# Patient Record
Sex: Female | Born: 1950 | Hispanic: No | Marital: Married
Health system: Southern US, Community
[De-identification: ages and names within clinical notes are randomized; demographics above are authoritative.]

## PROBLEM LIST (undated history)

## (undated) DIAGNOSIS — U071 COVID-19: Secondary | ICD-10-CM

## (undated) DIAGNOSIS — K922 Gastrointestinal hemorrhage, unspecified: Secondary | ICD-10-CM

## (undated) DIAGNOSIS — E119 Type 2 diabetes mellitus without complications: Secondary | ICD-10-CM

## (undated) DIAGNOSIS — I2699 Other pulmonary embolism without acute cor pulmonale: Secondary | ICD-10-CM

## (undated) DIAGNOSIS — I63511 Cerebral infarction due to unspecified occlusion or stenosis of right middle cerebral artery: Secondary | ICD-10-CM

## (undated) DIAGNOSIS — J9621 Acute and chronic respiratory failure with hypoxia: Secondary | ICD-10-CM

## (undated) DIAGNOSIS — I639 Cerebral infarction, unspecified: Secondary | ICD-10-CM

---

## 2019-12-12 ENCOUNTER — Other Ambulatory Visit (HOSPITAL_COMMUNITY): Payer: Medicare HMO

## 2019-12-12 ENCOUNTER — Inpatient Hospital Stay
Admission: RE | Admit: 2019-12-12 | Discharge: 2019-12-16 | Disposition: A | Discharge status: Still In Hospital | Payer: Medicare HMO | Source: Other Acute Inpatient Hospital | Attending: Internal Medicine | Admitting: Internal Medicine

## 2019-12-12 DIAGNOSIS — Z931 Gastrostomy status: Secondary | ICD-10-CM

## 2019-12-12 DIAGNOSIS — R58 Hemorrhage, not elsewhere classified: Secondary | ICD-10-CM

## 2019-12-12 DIAGNOSIS — Z930 Tracheostomy status: Secondary | ICD-10-CM

## 2019-12-12 DIAGNOSIS — Z452 Encounter for adjustment and management of vascular access device: Secondary | ICD-10-CM

## 2019-12-13 LAB — COMPREHENSIVE METABOLIC PANEL
ALT: 170 U/L — ABNORMAL HIGH (ref 0–44)
AST: 59 U/L — ABNORMAL HIGH (ref 15–41)
Albumin: 1.7 g/dL — ABNORMAL LOW (ref 3.5–5.0)
Alkaline Phosphatase: 365 U/L — ABNORMAL HIGH (ref 38–126)
Anion gap: 9 (ref 5–15)
BUN: 36 mg/dL — ABNORMAL HIGH (ref 8–23)
CO2: 24 mmol/L (ref 22–32)
Calcium: 8.2 mg/dL — ABNORMAL LOW (ref 8.9–10.3)
Chloride: 116 mmol/L — ABNORMAL HIGH (ref 98–111)
Creatinine, Ser: 1.1 mg/dL — ABNORMAL HIGH (ref 0.44–1.00)
GFR calc Af Amer: >60 mL/min (ref >60)
GFR calc non Af Amer: 52 mL/min — ABNORMAL LOW (ref >60)
Glucose, Bld: 117 mg/dL — ABNORMAL HIGH (ref 70–99)
Potassium: 3.8 mmol/L (ref 3.5–5.1)
Sodium: 149 mmol/L — ABNORMAL HIGH (ref 135–145)
Total Bilirubin: 0.4 mg/dL (ref 0.3–1.2)
Total Protein: 5.6 g/dL — ABNORMAL LOW (ref 6.5–8.1)

## 2019-12-13 LAB — BLOOD GAS, ARTERIAL
Acid-Base Excess: 0.5 mmol/L (ref 0.0–2.0)
Bicarbonate: 24.6 mmol/L (ref 20.0–28.0)
FIO2: 28
O2 Saturation: 95.1 %
Patient temperature: 37
pCO2 arterial: 39.2 mmHg (ref 32.0–48.0)
pH, Arterial: 7.414 (ref 7.350–7.450)
pO2, Arterial: 70.7 mmHg — ABNORMAL LOW (ref 83.0–108.0)

## 2019-12-13 LAB — PROTIME-INR
INR: 1 (ref 0.8–1.2)
Prothrombin Time: 13.5 seconds (ref 11.4–15.2)

## 2019-12-13 LAB — TSH: TSH: 1.725 u[IU]/mL (ref 0.350–4.500)

## 2019-12-13 LAB — CBC
HCT: 28.3 % — ABNORMAL LOW (ref 36.0–46.0)
Hemoglobin: 8.6 g/dL — ABNORMAL LOW (ref 12.0–15.0)
MCH: 30 pg (ref 26.0–34.0)
MCHC: 30.4 g/dL (ref 30.0–36.0)
MCV: 98.6 fL (ref 80.0–100.0)
Platelets: 144 10*3/uL — ABNORMAL LOW (ref 150–400)
RBC: 2.87 MIL/uL — ABNORMAL LOW (ref 3.87–5.11)
RDW: 15.3 % (ref 11.5–15.5)
WBC: 11.7 10*3/uL — ABNORMAL HIGH (ref 4.0–10.5)
nRBC: 0 % (ref 0.0–0.2)

## 2019-12-13 LAB — APTT: aPTT: 27 seconds (ref 24–36)

## 2019-12-13 MED ORDER — AMLODIPINE BESYLATE 10 MG PO TABS
10.00 | ORAL_TABLET | ORAL | Status: DC
Start: 2019-12-13 — End: 2019-12-13

## 2019-12-13 MED ORDER — GENERIC EXTERNAL MEDICATION
Status: DC
Start: ? — End: 2019-12-13

## 2019-12-13 MED ORDER — AMANTADINE HCL 100 MG PO CAPS
100.00 | ORAL_CAPSULE | ORAL | Status: DC
Start: 2019-12-12 — End: 2019-12-13

## 2019-12-13 MED ORDER — SODIUM CHLORIDE 0.9 % IV SOLN
10.00 | INTRAVENOUS | Status: DC
Start: ? — End: 2019-12-13

## 2019-12-13 MED ORDER — DEXTROSE-SODIUM CHLORIDE 5-0.9 % IV SOLN
50.00 | INTRAVENOUS | Status: DC
Start: ? — End: 2019-12-13

## 2019-12-13 MED ORDER — LABETALOL HCL 5 MG/ML IV SOLN
10.00 | INTRAVENOUS | Status: DC
Start: ? — End: 2019-12-13

## 2019-12-13 MED ORDER — FENTANYL CITRATE (PF) 2500 MCG/50ML IJ SOLN
25.00 | INTRAMUSCULAR | Status: DC
Start: ? — End: 2019-12-13

## 2019-12-13 MED ORDER — INSULIN GLARGINE 100 UNIT/ML ~~LOC~~ SOLN
1.00 | SUBCUTANEOUS | Status: DC
Start: 2019-12-13 — End: 2019-12-13

## 2019-12-13 MED ORDER — CHOLECALCIFEROL 25 MCG (1000 UT) PO TABS
4000.00 | ORAL_TABLET | ORAL | Status: DC
Start: 2019-12-13 — End: 2019-12-13

## 2019-12-13 MED ORDER — LEVOTHYROXINE SODIUM 75 MCG PO TABS
75.00 | ORAL_TABLET | ORAL | Status: DC
Start: 2019-12-12 — End: 2019-12-13

## 2019-12-13 MED ORDER — CLOTRIMAZOLE-BETAMETHASONE 1-0.05 % EX CREA
TOPICAL_CREAM | CUTANEOUS | Status: DC
Start: 2019-12-12 — End: 2019-12-13

## 2019-12-13 MED ORDER — INSULIN LISPRO 100 UNIT/ML ~~LOC~~ SOLN
1.00 | SUBCUTANEOUS | Status: DC
Start: 2019-12-12 — End: 2019-12-13

## 2019-12-13 MED ORDER — MAGNESIUM OXIDE 400 MG PO TABS
800.00 | ORAL_TABLET | ORAL | Status: DC
Start: 2019-12-12 — End: 2019-12-13

## 2019-12-13 MED ORDER — INSULIN LISPRO 100 UNIT/ML ~~LOC~~ SOLN
1.00 | SUBCUTANEOUS | Status: DC
Start: ? — End: 2019-12-13

## 2019-12-13 MED ORDER — METOPROLOL TARTRATE 5 MG/5ML IV SOLN
5.00 | INTRAVENOUS | Status: DC
Start: ? — End: 2019-12-13

## 2019-12-13 MED ORDER — LABETALOL HCL 200 MG PO TABS
200.00 | ORAL_TABLET | ORAL | Status: DC
Start: 2019-12-12 — End: 2019-12-13

## 2019-12-13 MED ORDER — PANTOPRAZOLE SODIUM 40 MG IV SOLR
40.00 | INTRAVENOUS | Status: DC
Start: 2019-12-12 — End: 2019-12-13

## 2019-12-13 MED ORDER — ALUM HYDROXIDE-MAG TRISILICATE 80-20 MG PO CHEW
1000.00 | CHEWABLE_TABLET | ORAL | Status: DC
Start: ? — End: 2019-12-13

## 2019-12-13 MED ORDER — DEXMEDETOMIDINE HCL IN NACL 400 MCG/100ML IV SOLN
0.10 | INTRAVENOUS | Status: DC
Start: ? — End: 2019-12-13

## 2019-12-14 ENCOUNTER — Other Ambulatory Visit (HOSPITAL_COMMUNITY): Payer: Medicare HMO

## 2019-12-14 LAB — CBC
HCT: 26.9 % — ABNORMAL LOW (ref 36.0–46.0)
HCT: 23.4 % — ABNORMAL LOW (ref 36.0–46.0)
HCT: 22.1 % — ABNORMAL LOW (ref 36.0–46.0)
HCT: 22.9 % — ABNORMAL LOW (ref 36.0–46.0)
Hemoglobin: 8.5 g/dL — ABNORMAL LOW (ref 12.0–15.0)
Hemoglobin: 7.3 g/dL — ABNORMAL LOW (ref 12.0–15.0)
Hemoglobin: 6.9 g/dL — CL (ref 12.0–15.0)
Hemoglobin: 7.4 g/dL — ABNORMAL LOW (ref 12.0–15.0)
MCH: 30.5 pg (ref 26.0–34.0)
MCH: 30.3 pg (ref 26.0–34.0)
MCH: 30.5 pg (ref 26.0–34.0)
MCH: 30.3 pg (ref 26.0–34.0)
MCHC: 31.6 g/dL (ref 30.0–36.0)
MCHC: 31.2 g/dL (ref 30.0–36.0)
MCHC: 31.2 g/dL (ref 30.0–36.0)
MCHC: 32.3 g/dL (ref 30.0–36.0)
MCV: 96.4 fL (ref 80.0–100.0)
MCV: 97.1 fL (ref 80.0–100.0)
MCV: 97.8 fL (ref 80.0–100.0)
MCV: 93.9 fL (ref 80.0–100.0)
Platelets: 159 10*3/uL (ref 150–400)
Platelets: 150 10*3/uL (ref 150–400)
Platelets: 145 10*3/uL — ABNORMAL LOW (ref 150–400)
Platelets: 145 10*3/uL — ABNORMAL LOW (ref 150–400)
RBC: 2.79 MIL/uL — ABNORMAL LOW (ref 3.87–5.11)
RBC: 2.41 MIL/uL — ABNORMAL LOW (ref 3.87–5.11)
RBC: 2.26 MIL/uL — ABNORMAL LOW (ref 3.87–5.11)
RBC: 2.44 MIL/uL — ABNORMAL LOW (ref 3.87–5.11)
RDW: 15.7 % — ABNORMAL HIGH (ref 11.5–15.5)
RDW: 15.6 % — ABNORMAL HIGH (ref 11.5–15.5)
RDW: 15.8 % — ABNORMAL HIGH (ref 11.5–15.5)
RDW: 16.7 % — ABNORMAL HIGH (ref 11.5–15.5)
WBC: 11.9 10*3/uL — ABNORMAL HIGH (ref 4.0–10.5)
WBC: 9.7 10*3/uL (ref 4.0–10.5)
WBC: 11.1 10*3/uL — ABNORMAL HIGH (ref 4.0–10.5)
WBC: 11.9 10*3/uL — ABNORMAL HIGH (ref 4.0–10.5)
nRBC: 0 % (ref 0.0–0.2)
nRBC: 0 % (ref 0.0–0.2)
nRBC: 0 % (ref 0.0–0.2)
nRBC: 0.2 % (ref 0.0–0.2)

## 2019-12-14 LAB — DIC (DISSEMINATED INTRAVASCULAR COAGULATION)PANEL
D-Dimer, Quant: 3.3 ug/mL-FEU — ABNORMAL HIGH (ref 0.00–0.50)
Fibrinogen: 612 mg/dL — ABNORMAL HIGH (ref 210–475)
INR: 1.1 (ref 0.8–1.2)
Platelets: 138 10*3/uL — ABNORMAL LOW (ref 150–400)
Prothrombin Time: 14.1 seconds (ref 11.4–15.2)
Smear Review: NONE SEEN
aPTT: 29 seconds (ref 24–36)

## 2019-12-14 LAB — PREPARE RBC (CROSSMATCH)

## 2019-12-14 LAB — ABO/RH: ABO/RH(D): A POS

## 2019-12-14 MED ORDER — GENERIC EXTERNAL MEDICATION
Status: DC
Start: ? — End: 2019-12-14

## 2019-12-14 MED ORDER — IOHEXOL 300 MG/ML  SOLN
100.0000 mL | Freq: Once | INTRAMUSCULAR | Status: AC | PRN
  Administered 2019-12-14: 100 mL via INTRAVENOUS

## 2019-12-15 ENCOUNTER — Other Ambulatory Visit (HOSPITAL_COMMUNITY): Payer: Medicare HMO

## 2019-12-15 LAB — CBC
HCT: 21.2 % — ABNORMAL LOW (ref 36.0–46.0)
HCT: 22.6 % — ABNORMAL LOW (ref 36.0–46.0)
HCT: 24.3 % — ABNORMAL LOW (ref 36.0–46.0)
HCT: 20.4 % — ABNORMAL LOW (ref 36.0–46.0)
Hemoglobin: 6.8 g/dL — CL (ref 12.0–15.0)
Hemoglobin: 7.4 g/dL — ABNORMAL LOW (ref 12.0–15.0)
Hemoglobin: 8.1 g/dL — ABNORMAL LOW (ref 12.0–15.0)
Hemoglobin: 6.6 g/dL — CL (ref 12.0–15.0)
MCH: 30.4 pg (ref 26.0–34.0)
MCH: 30.5 pg (ref 26.0–34.0)
MCH: 30.6 pg (ref 26.0–34.0)
MCH: 30.3 pg (ref 26.0–34.0)
MCHC: 32.1 g/dL (ref 30.0–36.0)
MCHC: 32.7 g/dL (ref 30.0–36.0)
MCHC: 33.3 g/dL (ref 30.0–36.0)
MCHC: 32.4 g/dL (ref 30.0–36.0)
MCV: 94.6 fL (ref 80.0–100.0)
MCV: 93 fL (ref 80.0–100.0)
MCV: 91.7 fL (ref 80.0–100.0)
MCV: 93.6 fL (ref 80.0–100.0)
Platelets: 113 10*3/uL — ABNORMAL LOW (ref 150–400)
Platelets: 114 10*3/uL — ABNORMAL LOW (ref 150–400)
Platelets: 104 10*3/uL — ABNORMAL LOW (ref 150–400)
Platelets: 105 10*3/uL — ABNORMAL LOW (ref 150–400)
RBC: 2.24 MIL/uL — ABNORMAL LOW (ref 3.87–5.11)
RBC: 2.43 MIL/uL — ABNORMAL LOW (ref 3.87–5.11)
RBC: 2.65 MIL/uL — ABNORMAL LOW (ref 3.87–5.11)
RBC: 2.18 MIL/uL — ABNORMAL LOW (ref 3.87–5.11)
RDW: 16.8 % — ABNORMAL HIGH (ref 11.5–15.5)
RDW: 16.4 % — ABNORMAL HIGH (ref 11.5–15.5)
RDW: 16.5 % — ABNORMAL HIGH (ref 11.5–15.5)
RDW: 16.6 % — ABNORMAL HIGH (ref 11.5–15.5)
WBC: 12.4 10*3/uL — ABNORMAL HIGH (ref 4.0–10.5)
WBC: 12 10*3/uL — ABNORMAL HIGH (ref 4.0–10.5)
WBC: 12.6 10*3/uL — ABNORMAL HIGH (ref 4.0–10.5)
WBC: 10.1 10*3/uL (ref 4.0–10.5)
nRBC: 0.2 % (ref 0.0–0.2)
nRBC: 0 % (ref 0.0–0.2)
nRBC: 0.2 % (ref 0.0–0.2)
nRBC: 0.2 % (ref 0.0–0.2)

## 2019-12-15 LAB — PREPARE RBC (CROSSMATCH)

## 2019-12-15 MED ORDER — GENERIC EXTERNAL MEDICATION
Status: DC
Start: ? — End: 2019-12-15

## 2019-12-16 ENCOUNTER — Other Ambulatory Visit: Payer: Self-pay

## 2019-12-16 ENCOUNTER — Encounter (HOSPITAL_COMMUNITY): Payer: Self-pay | Admitting: Emergency Medicine

## 2019-12-16 ENCOUNTER — Inpatient Hospital Stay (HOSPITAL_COMMUNITY)
Admission: EM | Admit: 2019-12-16 | Discharge: 2019-12-19 | DRG: 377 | Disposition: A | Discharge status: Other Acute Inpatient Hospital | Payer: Medicare HMO | Source: Other Acute Inpatient Hospital | Attending: Internal Medicine | Admitting: Internal Medicine

## 2019-12-16 ENCOUNTER — Emergency Department (HOSPITAL_COMMUNITY): Payer: Medicare HMO

## 2019-12-16 DIAGNOSIS — Z794 Long term (current) use of insulin: Secondary | ICD-10-CM

## 2019-12-16 DIAGNOSIS — Z9981 Dependence on supplemental oxygen: Secondary | ICD-10-CM

## 2019-12-16 DIAGNOSIS — Z79890 Hormone replacement therapy (postmenopausal): Secondary | ICD-10-CM

## 2019-12-16 DIAGNOSIS — Z8709 Personal history of other diseases of the respiratory system: Secondary | ICD-10-CM | POA: Diagnosis not present

## 2019-12-16 DIAGNOSIS — I2699 Other pulmonary embolism without acute cor pulmonale: Secondary | ICD-10-CM | POA: Diagnosis not present

## 2019-12-16 DIAGNOSIS — Z930 Tracheostomy status: Secondary | ICD-10-CM

## 2019-12-16 DIAGNOSIS — I69398 Other sequelae of cerebral infarction: Secondary | ICD-10-CM | POA: Diagnosis not present

## 2019-12-16 DIAGNOSIS — Z7189 Other specified counseling: Secondary | ICD-10-CM | POA: Diagnosis not present

## 2019-12-16 DIAGNOSIS — K921 Melena: Principal | ICD-10-CM | POA: Diagnosis present

## 2019-12-16 DIAGNOSIS — J189 Pneumonia, unspecified organism: Secondary | ICD-10-CM

## 2019-12-16 DIAGNOSIS — Z931 Gastrostomy status: Secondary | ICD-10-CM | POA: Diagnosis not present

## 2019-12-16 DIAGNOSIS — U071 COVID-19: Secondary | ICD-10-CM | POA: Diagnosis not present

## 2019-12-16 DIAGNOSIS — N939 Abnormal uterine and vaginal bleeding, unspecified: Secondary | ICD-10-CM | POA: Diagnosis present

## 2019-12-16 DIAGNOSIS — R69 Illness, unspecified: Secondary | ICD-10-CM | POA: Diagnosis not present

## 2019-12-16 DIAGNOSIS — E11649 Type 2 diabetes mellitus with hypoglycemia without coma: Secondary | ICD-10-CM | POA: Diagnosis not present

## 2019-12-16 DIAGNOSIS — Z8616 Personal history of COVID-19: Secondary | ICD-10-CM | POA: Diagnosis not present

## 2019-12-16 DIAGNOSIS — E8809 Other disorders of plasma-protein metabolism, not elsewhere classified: Secondary | ICD-10-CM | POA: Diagnosis present

## 2019-12-16 DIAGNOSIS — E119 Type 2 diabetes mellitus without complications: Secondary | ICD-10-CM | POA: Diagnosis not present

## 2019-12-16 DIAGNOSIS — Z79899 Other long term (current) drug therapy: Secondary | ICD-10-CM | POA: Diagnosis not present

## 2019-12-16 DIAGNOSIS — J9620 Acute and chronic respiratory failure, unspecified whether with hypoxia or hypercapnia: Secondary | ICD-10-CM | POA: Diagnosis present

## 2019-12-16 DIAGNOSIS — R601 Generalized edema: Secondary | ICD-10-CM | POA: Diagnosis present

## 2019-12-16 DIAGNOSIS — L89150 Pressure ulcer of sacral region, unstageable: Secondary | ICD-10-CM | POA: Diagnosis present

## 2019-12-16 DIAGNOSIS — Z7982 Long term (current) use of aspirin: Secondary | ICD-10-CM | POA: Diagnosis not present

## 2019-12-16 DIAGNOSIS — I10 Essential (primary) hypertension: Secondary | ICD-10-CM | POA: Diagnosis present

## 2019-12-16 DIAGNOSIS — Z7401 Bed confinement status: Secondary | ICD-10-CM | POA: Diagnosis not present

## 2019-12-16 DIAGNOSIS — I4891 Unspecified atrial fibrillation: Secondary | ICD-10-CM | POA: Diagnosis present

## 2019-12-16 DIAGNOSIS — K922 Gastrointestinal hemorrhage, unspecified: Secondary | ICD-10-CM | POA: Diagnosis present

## 2019-12-16 DIAGNOSIS — Z8673 Personal history of transient ischemic attack (TIA), and cerebral infarction without residual deficits: Secondary | ICD-10-CM | POA: Diagnosis not present

## 2019-12-16 DIAGNOSIS — L89159 Pressure ulcer of sacral region, unspecified stage: Secondary | ICD-10-CM | POA: Diagnosis not present

## 2019-12-16 DIAGNOSIS — E039 Hypothyroidism, unspecified: Secondary | ICD-10-CM | POA: Diagnosis present

## 2019-12-16 DIAGNOSIS — G9349 Other encephalopathy: Secondary | ICD-10-CM | POA: Diagnosis present

## 2019-12-16 DIAGNOSIS — D62 Acute posthemorrhagic anemia: Secondary | ICD-10-CM | POA: Diagnosis present

## 2019-12-16 DIAGNOSIS — Z885 Allergy status to narcotic agent status: Secondary | ICD-10-CM

## 2019-12-16 DIAGNOSIS — R403 Persistent vegetative state: Secondary | ICD-10-CM | POA: Diagnosis present

## 2019-12-16 DIAGNOSIS — J9621 Acute and chronic respiratory failure with hypoxia: Secondary | ICD-10-CM | POA: Diagnosis not present

## 2019-12-16 DIAGNOSIS — E669 Obesity, unspecified: Secondary | ICD-10-CM | POA: Diagnosis present

## 2019-12-16 DIAGNOSIS — Z6835 Body mass index (BMI) 35.0-35.9, adult: Secondary | ICD-10-CM

## 2019-12-16 DIAGNOSIS — Z86711 Personal history of pulmonary embolism: Secondary | ICD-10-CM

## 2019-12-16 DIAGNOSIS — J690 Pneumonitis due to inhalation of food and vomit: Secondary | ICD-10-CM | POA: Diagnosis present

## 2019-12-16 DIAGNOSIS — I69318 Other symptoms and signs involving cognitive functions following cerebral infarction: Secondary | ICD-10-CM | POA: Diagnosis not present

## 2019-12-16 DIAGNOSIS — J9600 Acute respiratory failure, unspecified whether with hypoxia or hypercapnia: Secondary | ICD-10-CM

## 2019-12-16 DIAGNOSIS — J90 Pleural effusion, not elsewhere classified: Secondary | ICD-10-CM | POA: Diagnosis not present

## 2019-12-16 DIAGNOSIS — Z9889 Other specified postprocedural states: Secondary | ICD-10-CM | POA: Diagnosis not present

## 2019-12-16 DIAGNOSIS — R918 Other nonspecific abnormal finding of lung field: Secondary | ICD-10-CM | POA: Diagnosis not present

## 2019-12-16 DIAGNOSIS — L8990 Pressure ulcer of unspecified site, unspecified stage: Secondary | ICD-10-CM | POA: Insufficient documentation

## 2019-12-16 DIAGNOSIS — Z515 Encounter for palliative care: Secondary | ICD-10-CM

## 2019-12-16 DIAGNOSIS — I63511 Cerebral infarction due to unspecified occlusion or stenosis of right middle cerebral artery: Secondary | ICD-10-CM | POA: Diagnosis not present

## 2019-12-16 HISTORY — DX: Cerebral infarction, unspecified: I63.9

## 2019-12-16 HISTORY — DX: Type 2 diabetes mellitus without complications: E11.9

## 2019-12-16 HISTORY — DX: COVID-19: U07.1

## 2019-12-16 HISTORY — DX: Other pulmonary embolism without acute cor pulmonale: I26.99

## 2019-12-16 LAB — BPAM RBC
Blood Product Expiration Date: 202103182359
Blood Product Expiration Date: 202103212359
Blood Product Expiration Date: 202103222359
ISSUE DATE / TIME: 202102251555
ISSUE DATE / TIME: 202102260632
ISSUE DATE / TIME: 202102262342
Unit Type and Rh: 6200
Unit Type and Rh: 6200
Unit Type and Rh: 6200

## 2019-12-16 LAB — COMPREHENSIVE METABOLIC PANEL
ALT: 47 U/L — ABNORMAL HIGH (ref 0–44)
AST: 19 U/L (ref 15–41)
Albumin: 1.2 g/dL — ABNORMAL LOW (ref 3.5–5.0)
Alkaline Phosphatase: 115 U/L (ref 38–126)
Anion gap: 10 (ref 5–15)
BUN: 39 mg/dL — ABNORMAL HIGH (ref 8–23)
CO2: 24 mmol/L (ref 22–32)
Calcium: 7.1 mg/dL — ABNORMAL LOW (ref 8.9–10.3)
Chloride: 115 mmol/L — ABNORMAL HIGH (ref 98–111)
Creatinine, Ser: 1.23 mg/dL — ABNORMAL HIGH (ref 0.44–1.00)
GFR calc Af Amer: 53 mL/min — ABNORMAL LOW (ref >60)
GFR calc non Af Amer: 45 mL/min — ABNORMAL LOW (ref >60)
Glucose, Bld: 122 mg/dL — ABNORMAL HIGH (ref 70–99)
Potassium: 3.3 mmol/L — ABNORMAL LOW (ref 3.5–5.1)
Sodium: 149 mmol/L — ABNORMAL HIGH (ref 135–145)
Total Bilirubin: 0.8 mg/dL (ref 0.3–1.2)
Total Protein: 3.6 g/dL — ABNORMAL LOW (ref 6.5–8.1)

## 2019-12-16 LAB — CBC WITH DIFFERENTIAL/PLATELET
Abs Immature Granulocytes: 0.26 10*3/uL — ABNORMAL HIGH (ref 0.00–0.07)
Basophils Absolute: 0 10*3/uL (ref 0.0–0.1)
Basophils Relative: 0 %
Eosinophils Absolute: 0.1 10*3/uL (ref 0.0–0.5)
Eosinophils Relative: 1 %
HCT: 19.2 % — ABNORMAL LOW (ref 36.0–46.0)
Hemoglobin: 6 g/dL — CL (ref 12.0–15.0)
Immature Granulocytes: 3 %
Lymphocytes Relative: 16 %
Lymphs Abs: 1.6 10*3/uL (ref 0.7–4.0)
MCH: 30.3 pg (ref 26.0–34.0)
MCHC: 31.3 g/dL (ref 30.0–36.0)
MCV: 97 fL (ref 80.0–100.0)
Monocytes Absolute: 0.7 10*3/uL (ref 0.1–1.0)
Monocytes Relative: 7 %
Neutro Abs: 7.2 10*3/uL (ref 1.7–7.7)
Neutrophils Relative %: 73 %
Platelets: 102 10*3/uL — ABNORMAL LOW (ref 150–400)
RBC: 1.98 MIL/uL — ABNORMAL LOW (ref 3.87–5.11)
RDW: 16.2 % — ABNORMAL HIGH (ref 11.5–15.5)
WBC: 9.8 10*3/uL (ref 4.0–10.5)
nRBC: 0.3 % — ABNORMAL HIGH (ref 0.0–0.2)

## 2019-12-16 LAB — TYPE AND SCREEN
ABO/RH(D): A POS
Antibody Screen: NEGATIVE
Unit division: 0
Unit division: 0
Unit division: 0

## 2019-12-16 LAB — CBC
HCT: 24.8 % — ABNORMAL LOW (ref 36.0–46.0)
HCT: 20.6 % — ABNORMAL LOW (ref 36.0–46.0)
Hemoglobin: 8.1 g/dL — ABNORMAL LOW (ref 12.0–15.0)
Hemoglobin: 6.6 g/dL — CL (ref 12.0–15.0)
MCH: 30.5 pg (ref 26.0–34.0)
MCH: 30 pg (ref 26.0–34.0)
MCHC: 32.7 g/dL (ref 30.0–36.0)
MCHC: 32 g/dL (ref 30.0–36.0)
MCV: 93.2 fL (ref 80.0–100.0)
MCV: 93.6 fL (ref 80.0–100.0)
Platelets: 94 10*3/uL — ABNORMAL LOW (ref 150–400)
Platelets: 104 10*3/uL — ABNORMAL LOW (ref 150–400)
RBC: 2.66 MIL/uL — ABNORMAL LOW (ref 3.87–5.11)
RBC: 2.2 MIL/uL — ABNORMAL LOW (ref 3.87–5.11)
RDW: 15.9 % — ABNORMAL HIGH (ref 11.5–15.5)
RDW: 15.9 % — ABNORMAL HIGH (ref 11.5–15.5)
WBC: 11.6 10*3/uL — ABNORMAL HIGH (ref 4.0–10.5)
WBC: 10.6 10*3/uL — ABNORMAL HIGH (ref 4.0–10.5)
nRBC: 0 % (ref 0.0–0.2)
nRBC: 0.3 % — ABNORMAL HIGH (ref 0.0–0.2)

## 2019-12-16 LAB — BASIC METABOLIC PANEL
Anion gap: 9 (ref 5–15)
BUN: 34 mg/dL — ABNORMAL HIGH (ref 8–23)
CO2: 24 mmol/L (ref 22–32)
Calcium: 7.1 mg/dL — ABNORMAL LOW (ref 8.9–10.3)
Chloride: 117 mmol/L — ABNORMAL HIGH (ref 98–111)
Creatinine, Ser: 1.31 mg/dL — ABNORMAL HIGH (ref 0.44–1.00)
GFR calc Af Amer: 49 mL/min — ABNORMAL LOW (ref >60)
GFR calc non Af Amer: 42 mL/min — ABNORMAL LOW (ref >60)
Glucose, Bld: 104 mg/dL — ABNORMAL HIGH (ref 70–99)
Potassium: 3.7 mmol/L (ref 3.5–5.1)
Sodium: 150 mmol/L — ABNORMAL HIGH (ref 135–145)

## 2019-12-16 LAB — PREPARE RBC (CROSSMATCH)

## 2019-12-16 LAB — GLUCOSE, CAPILLARY: Glucose-Capillary: 125 mg/dL — ABNORMAL HIGH (ref 70–99)

## 2019-12-16 LAB — LACTIC ACID, PLASMA: Lactic Acid, Venous: 1.1 mmol/L (ref 0.5–1.9)

## 2019-12-16 LAB — HEMOGLOBIN A1C
Hgb A1c MFr Bld: 5.4 % (ref 4.8–5.6)
Mean Plasma Glucose: 108.28 mg/dL

## 2019-12-16 LAB — PROTIME-INR
INR: 1.2 (ref 0.8–1.2)
Prothrombin Time: 15.2 seconds (ref 11.4–15.2)

## 2019-12-16 LAB — LIPASE, BLOOD: Lipase: 35 U/L (ref 11–51)

## 2019-12-16 MED ORDER — POTASSIUM CHLORIDE CRYS ER 20 MEQ PO TBCR
40.0000 meq | EXTENDED_RELEASE_TABLET | Freq: Two times a day (BID) | ORAL | Status: DC
  Administered 2019-12-16: 23:00:00 40 meq via ORAL
  Filled 2019-12-16 (×2): qty 2

## 2019-12-16 MED ORDER — INSULIN ASPART 100 UNIT/ML ~~LOC~~ SOLN
0.0000 [IU] | Freq: Three times a day (TID) | SUBCUTANEOUS | Status: DC
  Administered 2019-12-19 (×2): 2 [IU] via SUBCUTANEOUS

## 2019-12-16 MED ORDER — LEVOTHYROXINE SODIUM 75 MCG PO TABS
75.0000 ug | ORAL_TABLET | Freq: Every day | ORAL | Status: DC
  Administered 2019-12-17 – 2019-12-19 (×3): 75 ug via ORAL
  Filled 2019-12-16 (×3): qty 1

## 2019-12-16 MED ORDER — SODIUM CHLORIDE 0.9 % IV SOLN
2.0000 g | Freq: Two times a day (BID) | INTRAVENOUS | Status: DC
  Administered 2019-12-16 – 2019-12-17 (×2): 2 g via INTRAVENOUS
  Filled 2019-12-16 (×3): qty 2

## 2019-12-16 MED ORDER — PANTOPRAZOLE SODIUM 40 MG IV SOLR
40.0000 mg | Freq: Two times a day (BID) | INTRAVENOUS | Status: DC
  Administered 2019-12-16 – 2019-12-19 (×6): 40 mg via INTRAVENOUS
  Filled 2019-12-16 (×6): qty 40

## 2019-12-16 MED ORDER — METRONIDAZOLE IN NACL 5-0.79 MG/ML-% IV SOLN
500.0000 mg | Freq: Three times a day (TID) | INTRAVENOUS | Status: DC
  Administered 2019-12-16 – 2019-12-17 (×2): 500 mg via INTRAVENOUS
  Filled 2019-12-16 (×3): qty 100

## 2019-12-16 MED ORDER — SODIUM CHLORIDE 0.9 % IV SOLN
10.0000 mL/h | Freq: Once | INTRAVENOUS | Status: AC
  Administered 2019-12-17: 21:00:00 10 mL/h via INTRAVENOUS

## 2019-12-16 MED ORDER — FUROSEMIDE 10 MG/ML IJ SOLN
40.0000 mg | Freq: Two times a day (BID) | INTRAMUSCULAR | Status: DC
  Administered 2019-12-16 – 2019-12-17 (×2): 40 mg via INTRAVENOUS
  Filled 2019-12-16 (×3): qty 4

## 2019-12-16 NOTE — ED Triage Notes (Signed)
Pt sent down from [facility], where she is a Covid recovering trach pt since 1/10 per notes. She has been treated for GIB as well, arrives with Protonix gtt at 10/hr. Dbl lumen PICC to R arm, PEG tube present. Has been getting blood transfusions q day, reporting RN states continuous rectal bleeding w/clots. Hg 6.6. nonverbal on assessment, baseline for transferring nurse. Plan for ED eval per Dr. Charm Barges

## 2019-12-16 NOTE — ED Notes (Signed)
Please call daughter Jane Canary @ [phone]--for a status update--Rutger Salton

## 2019-12-16 NOTE — ED Notes (Signed)
Pt respiratory rate had been registering as apnic. Per nurse instruction, this user went into pt room to adjust EKG leads to ensure respiratory rate was reading properly. This user observed the patient breathing slowly. While this user was in the room the pt fell asleep and their respiratory rate dropped to 0 breaths per minute on the monitor and there were no visible or audible signs that the pt was breathing. This user immediately roused the pt by speaking loudly and gently shaking her shoulder. When the pt awoke her respiratory rate jumped to 22 breaths per minute then went back down to 8 within the minute. Pt does appear to be having episodes of apnea.

## 2019-12-16 NOTE — ED Notes (Signed)
Spoke with pt's daughter Dois Davenport. Dois Davenport gave this RN and RN April Marga Hoots verbal permission over the phone to transfuse Mrs. Jill Carlson. Dois Davenport also stated she would just like any updates as they come and when we know more.

## 2019-12-16 NOTE — H&P (Signed)
Date: 12/16/2019               Patient Name:  Jill Carlson MRN: [id number]  DOB: 11-Aug-1951 Age / Sex: 69 y.o., female   PCP: Patient, No Pcp Per         Medical Service: Internal Medicine Teaching Service         Attending Physician: Dr. Earl Lagos, MD    First Contact: Mcarthur Rossetti, MD, Allesandra Huebsch Pager: SA [phone])  Second Contact: Gwyneth Revels, MD, Marissa Pager: Sandrea Hammond [phone])       After Hours (After 5p/  First Contact Pager: [phone]  weekends / holidays): Second Contact Pager: [phone]   Chief Complaint: GI bleed  History of Present Illness: [name] is a  69 y.o. 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 Patient is non-verbal. History obtained via chart review. Patient noted to have history of GI bleed from PEG tube on Protonix. She has baseline Hb 7-8; however, over the past few days has had Hb <7, requiring recurrent blood transfusion and IV Protonix. Given persistent GI bleed, patient transferred to [facility] for GI consultation.   Of note, patient on cefepime and flagyl as of 2/26 for large bilateral effusions with compressive atelectasis in bilateral lower lobes; scattered ground-glass airspace opacities in right lung base and right middle lobe representative of possible multifocal pneumonia. Possible mild wall thickening of gastric antrum and pylorus.   In the ED, with Hb 6.6>6.0 for which she was given 2U pRBC. Hemodynamically stable. Labs significant for Na 149, K 3.3, Cl 115, CO2 24, BUN/Cr 39/1.23. Lactic acid 1.1. WBC 9.8, Plt 102. GI consulted and recommended for PEG tube lavage and assessment of blood return. If no blood return, recommended for nuclear bleeding scan and possible IR consult for intervention.Patient admitted to internal medicine for further  evaluation and management.    Meds:  Amantadine 100mg  bid Amlodipine 5mg  bid Labetalol 200mg  q12h Levothyroxine daily Modafinil 100mg  daily Humalog 1U q6h  Allergies: Allergies as of 12/16/2019 - Review Complete 12/16/2019  Allergen Reaction Noted  . Codeine  12/16/2019  . Vicodin [hydrocodone-acetaminophen]  12/16/2019   Past Medical History:  Diagnosis Date  . COVID-19   . CVA (cerebral vascular accident) (HCC)   . Diabetes mellitus without complication (HCC)   . Pulmonary embolism (HCC)     Family History:  Non-contributory   Social History:  Social History   Tobacco Use  . Smoking status: Unknown If Ever Smoked  . Smokeless tobacco: Never Used  Substance Use Topics  . Alcohol use: Never  . Drug use: Never   Patient is resident at [facility]. Her daughter, 12/18/2019, is patient's 12/18/2019.   Review of Systems: Unable to attain complete ROS due to patient being nonverbal.   Physical Exam: Blood pressure 130/65, pulse 67, temperature (!) 97.4 F (36.3 C), temperature source Axillary, resp. rate 20, weight 103.4 kg, SpO2 100 %. Physical Exam Vitals and nursing note reviewed.  Constitutional:      General: She is not in acute distress.    Appearance: She is ill-appearing. She is not toxic-appearing or diaphoretic.  HENT:     Head: Normocephalic and atraumatic.  Eyes:     General: No scleral icterus.    Extraocular Movements: Extraocular movements intact.  Cardiovascular:  Rate and Rhythm: Normal rate and regular rhythm.     Pulses: Normal pulses.     Heart sounds: Normal heart sounds. No murmur. No gallop.   Pulmonary:     Effort: Pulmonary effort is normal. No respiratory distress.     Breath sounds: No stridor. Rhonchi present. No wheezing or rales.     Comments: On 2L O2 via trach Rhoncherous breath sounds noted at bilateral bases Abdominal:     General: Bowel sounds are normal. There is no distension.     Palpations:  Abdomen is soft.     Tenderness: There is no abdominal tenderness. There is no guarding.  Musculoskeletal:        General: Swelling present. No tenderness or deformity.     Comments: Diffuse anasarca in bilateral upper and lower extremities  Skin:    General: Skin is warm and dry.     Capillary Refill: Capillary refill takes less than 2 seconds.     Findings: Lesion present. No bruising.     Comments: Per chart review, patient has a nonstageable sacral ulcer; unable to turn patient at time of examination to assess  Neurological:     Mental Status: She is alert and easily aroused.     Comments: Patient responsive to tactile stimulation; however, unable to assess neurologic status at this time     EKG: pending  CXR: personally reviewed my interpretation is persistent pulmonary congestion with bilateral interstitial edema and opacities. Possible bilateral effusions.  Assessment & Plan by Problem: Patient is a 69yr old female with history of R MCA ischemic stroke w/hemorrhagic conversion, acute respiratory failure s/p tracheostomy on 2L O2, DM II, and hypothyroidism presenting with acute blood loss anemia in setting of GI bleed.   GI Bleed: Acute blood loss anemia: Patient presented from [facility] for persistent GI bleed. Per records, patient has a history of GI bleed via PEG tube and has been on Protonix. However, over past few days, noted to have active bleeding per rectum with drop in Hb <7. She received multiple transfusions and IV protonix but continued to have active bleeding. On presentation, patient noted to have Hb 6.6>6.0 for which she was given 2U pRBC. GI consulted in the ED and recommended for PEG tube lavage which was negative for blood return.  - Post transfusion CBC - Nuclear bleeding scan - IR consultation if nuclear scan positive for active bleeding - IV Protonix 40mg  bid  - Transfuse prn for Hb>7  Acute respiratory failure s/p tracheostomy:  Possible  multifocal pneumonia:  Patient with history of acute respiratory failure s/p tracheostomy on 2L O2. Patient was started on cefepime and flagyl on 2/26 for CT suggestive of multifocal pneumonia. On examination, patient noted to have rhonchi at bilateral bases.  - IV Cefepime 2g tid - IV metronidazole 500mg  tid  R MCA ischemic stroke with hemorrhagic conversion:  Patient is on amlodipine 5mg  bid, lavetalol 200mg  q12h at facility. - Will hold off on resuming antihypertensives at this time  Anasarca: Diffuse anasarca on examination.  - IV Lasix 40mg  bid   DM II:  At facility, patient on Humalog 1U q6h.  - HbA1c - CBG monitoring - SSI   Hypothyroidism: Levothyroxine 3/26 daily  FEN/GI: Diet: NPO  Fluids: None  Electrolytes: Monitor and replete prn  VTE Prophylaxis: SCDs  Code status: FULL   Dispo: Admit patient to Inpatient with expected length of stay greater than 2 midnights.  Signed: , MD  Internal Medicine PGY-1  Pager: [phone] 12/16/2019, 6:00 PM

## 2019-12-16 NOTE — ED Provider Notes (Addendum)
[facility] EMERGENCY DEPARTMENT Provider Note   CSN: [id number] Arrival date & time: 12/16/19  1333     History Chief Complaint  Patient presents with  . GI Bleed    Jill Carlson is a 69 y.o. female.  HPI [facility]  Patient has been having ongoing rectal bleeding.  Reportedly she has had recurrent anemia with hemoglobin trending down into the mid 6 range.  They have been transfusing blood repeatedly and administering IV Protonix.  This is persisting.  Reportedly, there has been difficulty obtaining in facility gastroenterology consultation.  Patient is sent to the emergency department for further evaluation.  Patient has history of right MCA ischemic stroke with hemorrhagic conversion and remains encephalopathic.  Patient has history of respiratory failure and has a tracheostomy in place and remains on 2 L oxygen.  She also has history of type 2 diabetes.  Patient is currently getting cefepime and metronidazole started 2\26\21.  Patient has a CT scan documented in 2\26\21 note showing moderate to large bilateral effusions with compressive atelectasis bilateral lower lobes.  Scattered groundglass airspace opacities in the right lung base and right middle lobe could represent multifocal pneumonia.  Colon filled with contrast and some mild wall thickening of the gastric antrum and pylorus.  Patient is nonverbal and cannot contribute any history.    Past Medical History:  Diagnosis Date  . COVID-19   . CVA (cerebral vascular accident) ([place])   . Diabetes mellitus without complication ([place])   . Pulmonary embolism (HCC)     There are no problems to display for this patient.      OB History   No obstetric history on file.     No family history on file.  Social History   Tobacco Use  . Smoking status: Unknown If Ever Smoked  . Smokeless tobacco: Never Used  Substance Use Topics  . Alcohol  use: Never  . Drug use: Never    Home Medications Prior to Admission medications   Not on File    Allergies    Codeine and Vicodin [hydrocodone-acetaminophen]  Review of Systems   Review of Systems Level 5 caveat cannot obtain review of systems. Physical Exam Updated Vital Signs BP (!) 108/56   Pulse 60   Resp 16   Wt 103.4 kg   SpO2 100%   Physical Exam Constitutional:      Comments: Patient is in hospital bed.  She has a tracheostomy in place.  She is resting quietly.  She can open her eyes to some verbal and light tactile stimulus.  She does not make any verbal response.  Patient does not make spontaneous movements.  HENT:     Head: Normocephalic and atraumatic.  Cardiovascular:     Rate and Rhythm: Normal rate and regular rhythm.  Pulmonary:     Comments: Lungs are grossly clear.  Patient is on 2 L oxygen humidified per trach.  Respirations are fairly slow and deep. Abdominal:     Comments: Abdomen is generally soft.  Central obesity.  No significant rash or maceration in the groin fields or under the pannus.  Or under the breasts.  Feeding tube in place in the central abdomen.  Is well-healed.  There is no maceration or bleeding around the site.  There is no blood or dried blood in the tubing.  Genitourinary:    Comments: Examination of patient's buttocks shows some early breakdown around the sacrum symmetrically.  Skin is slightly macerated but not actively bleeding.  No tunneling wounds.  No purulence or drainage or discharge.  From patient's rectum there is a slow, persistent dribble of dark blood. Musculoskeletal:     Comments: Patient's extremities diffusely have edema consistent with anasarca.  Both upper extremities are slightly firm and edematous.  Lower extremities slightly firm and edematous.  Skin:    General: Skin is warm and dry.     Coloration: Skin is pale.  Neurological:     Comments: Patient is not responsive neurologically.  She makes no vocalizations.   She has no spontaneous movements of her extremities.     ED Results / Procedures / Treatments   Labs (all labs ordered are listed, but only abnormal results are displayed) Labs Reviewed  COMPREHENSIVE METABOLIC PANEL - Abnormal; Notable for the following components:      Result Value   Sodium 149 (*)    Potassium 3.3 (*)    Chloride 115 (*)    Glucose, Bld 122 (*)    BUN 39 (*)    Creatinine, Ser 1.23 (*)    Calcium 7.1 (*)    Total Protein 3.6 (*)    Albumin 1.2 (*)    ALT 47 (*)    GFR calc non Af Amer 45 (*)    GFR calc Af Amer 53 (*)    All other components within normal limits  CBC WITH DIFFERENTIAL/PLATELET - Abnormal; Notable for the following components:   RBC 1.98 (*)    Hemoglobin 6.0 (*)    HCT 19.2 (*)    RDW 16.2 (*)    Platelets 102 (*)    nRBC 0.3 (*)    All other components within normal limits  LIPASE, BLOOD  LACTIC ACID, PLASMA  PROTIME-INR  TYPE AND SCREEN  PREPARE RBC (CROSSMATCH)    EKG None  Radiology CT ABDOMEN PELVIS W CONTRAST  Result Date: 12/14/2019 CLINICAL DATA:  GI bleed. EXAM: CT ABDOMEN AND PELVIS WITH CONTRAST TECHNIQUE: Multidetector CT imaging of the abdomen and pelvis was performed using the standard protocol following bolus administration of intravenous contrast. CONTRAST:  OMNIPAQUE IOHEXOL 300 MG/ML  SOLN COMPARISON:  None. FINDINGS: Lower chest: There are moderate-sized partially visualized bilateral pleural effusions. There are scattered ground-glass airspace opacities visualized in the right lung base and right middle lobe. There is significant compressive atelectasis involving the bilateral lower lobes.The heart is enlarged. Hepatobiliary: The liver is normal. There is hyperdense material within the gallbladder lumen which may represent vicarious excretion from a prior contrast enhanced study.There is no biliary ductal dilation. Pancreas: Normal contours without ductal dilatation. No peripancreatic fluid collection. Spleen:  There is a wedge-shaped hypoattenuating defect the interpolar region of the spleen. Adrenals/Urinary Tract: --Adrenal glands: No adrenal hemorrhage. --Right kidney/ureter: No hydronephrosis or perinephric hematoma. --Left kidney/ureter: No hydronephrosis or perinephric hematoma. --Urinary bladder: There is a Foley catheter within the urinary bladder. Stomach/Bowel: --Stomach/Duodenum: A PEG tube is in place. There is some mild wall thickening of the gastric antrum and pylorus. --Small bowel: No dilatation or inflammation. --Colon: Colon is filled with contrast which limits detection of GI bleeding. --Appendix: Normal. Vascular/Lymphatic: Atherosclerotic calcification is present within the non-aneurysmal abdominal aorta, without hemodynamically significant stenosis. --No retroperitoneal lymphadenopathy. --No mesenteric lymphadenopathy. --No pelvic or inguinal lymphadenopathy. Reproductive: Again noted are uterine fibroids. Other: There is diffuse body wall edema. Injection granulomas are noted in the low anterior abdominal wall. Musculoskeletal. No acute displaced fractures. IMPRESSION: 1. Colon is filled with contrast  which limits detection of GI bleeding. There is some mild wall thickening of the gastric antrum and pylorus. This could be seen in the setting of gastritis. 2. There are moderate to large bilateral pleural effusions with significant compressive atelectasis involving the bilateral lower lobes. 3. There are scattered ground-glass airspace opacities in the right lung base and right middle lobe. This could represent multifocal pneumonia. 4. There is a wedge-shaped hypoattenuating defect the interpolar region of the spleen. This may represent a splenic infarct or laceration. There is no surrounding hematoma. 5. Anasarca. 6. Well-positioned PEG tube. 7. Aortic Atherosclerosis (ICD10-I70.0). Electronically Signed   By: Katherine Mantle M.D.   On: 12/14/2019 23:35   DG Chest Port 1 View  Result Date:  12/16/2019 CLINICAL DATA:  Tracheostomy with multiple medical complications. EXAM: PORTABLE CHEST 1 VIEW COMPARISON:  12/15/2019 FINDINGS: Tracheostomy tube and RIGHT PICC line again noted. Cardiomegaly, pulmonary vascular congestion with bilateral interstitial opacities, bibasilar opacities and probable effusions noted. There is no evidence of pneumothorax or acute bony abnormality. IMPRESSION: Little significant change with continued pulmonary vascular congestion with bilateral interstitial opacities/edema, bibasilar opacities and probable effusions. Electronically Signed   By: Harmon Pier M.D.   On: 12/16/2019 15:05   DG CHEST PORT 1 VIEW  Result Date: 12/15/2019 CLINICAL DATA:  PICC line placement. EXAM: PORTABLE CHEST 1 VIEW COMPARISON:  12/12/2019. FINDINGS: Interim placement of right PICC line. Tip at cavoatrial junction. Tracheostomy tube in stable position. Heart size normal. Diffuse bilateral pulmonary infiltrates/edema and bilateral pleural effusions. Similar findings noted on prior exam. No pneumothorax. No acute bony abnormality. IMPRESSION: 1. Interim placement of PICC line, its tip at the cavoatrial junction. Tracheostomy tube in stable position. 2. Diffuse bilateral pulmonary infiltrates/edema bilateral pleural effusions. Similar findings noted on prior exam. Electronically Signed   By: Maisie Fus  Register   On: 12/15/2019 14:50    Procedures Procedures (including critical care time) CRITICAL CARE Performed by: Arby Barrette   Total critical care time: 30 minutes  Critical care time was exclusive of separately billable procedures and treating other patients.  Critical care was necessary to treat or prevent imminent or life-threatening deterioration.  Critical care was time spent personally by me on the following activities: development of treatment plan with patient and/or surrogate as well as nursing, discussions with consultants, evaluation of patient's response to treatment,  examination of patient, obtaining history from patient or surrogate, ordering and performing treatments and interventions, ordering and review of laboratory studies, ordering and review of radiographic studies, pulse oximetry and re-evaluation of patient's condition. Medications Ordered in ED Medications  0.9 %  sodium chloride infusion (has no administration in time range)    ED Course  I have reviewed the triage vital signs and the nursing notes.  Pertinent labs & imaging results that were available during my care of the patient were reviewed by me and considered in my medical decision making (see chart for details).  Clinical Course as of Dec 15 1557  Sat Dec 16, 2019  1556 Consult: Reviewed with Dr. Doneta Public.  Recommends lavaging PEG tube with water and aspirating to see if there is return of blood.  If return of blood from the PEG, call Dr. Ewing Schlein back.  If no blood from the PEG, recommends nuclear bleeding scan and possible consultation to interventional radiology if positive findings.  Otherwise will reevaluate the patient tomorrow morning for consultation.   [MP]    Clinical Course User Index [MP] Arby Barrette, MD   MDM Rules/Calculators/A&P  Patient is transferred to the emergency department [facility]  Patient has many comorbid conditions from prior stroke.  She has had been having persistent GI bleeding.  They have been administering IV Protonix and required recurrent blood transfusions.  At this time, patient's hemoglobin is in the lower 6 range.  Will order blood transfusion.  Will consult gastroenterology and medical service. Final Clinical Impression(s) / ED Diagnoses Final diagnoses:  Gastrointestinal hemorrhage, unspecified gastrointestinal hemorrhage type  Severe comorbid illness    Rx / DC Orders ED Discharge Orders    None       Arby Barrette, MD 12/16/19 1524    Arby Barrette, MD 12/16/19 1600

## 2019-12-17 ENCOUNTER — Inpatient Hospital Stay (HOSPITAL_COMMUNITY): Payer: Medicare HMO

## 2019-12-17 ENCOUNTER — Encounter (HOSPITAL_COMMUNITY): Payer: Self-pay | Admitting: Internal Medicine

## 2019-12-17 DIAGNOSIS — E039 Hypothyroidism, unspecified: Secondary | ICD-10-CM

## 2019-12-17 DIAGNOSIS — Z9889 Other specified postprocedural states: Secondary | ICD-10-CM

## 2019-12-17 DIAGNOSIS — Z7902 Long term (current) use of antithrombotics/antiplatelets: Secondary | ICD-10-CM

## 2019-12-17 DIAGNOSIS — J189 Pneumonia, unspecified organism: Secondary | ICD-10-CM

## 2019-12-17 DIAGNOSIS — L89150 Pressure ulcer of sacral region, unstageable: Secondary | ICD-10-CM

## 2019-12-17 DIAGNOSIS — Z794 Long term (current) use of insulin: Secondary | ICD-10-CM

## 2019-12-17 DIAGNOSIS — Z931 Gastrostomy status: Secondary | ICD-10-CM

## 2019-12-17 DIAGNOSIS — Z930 Tracheostomy status: Secondary | ICD-10-CM

## 2019-12-17 DIAGNOSIS — E119 Type 2 diabetes mellitus without complications: Secondary | ICD-10-CM

## 2019-12-17 DIAGNOSIS — J90 Pleural effusion, not elsewhere classified: Secondary | ICD-10-CM

## 2019-12-17 DIAGNOSIS — Z79899 Other long term (current) drug therapy: Secondary | ICD-10-CM

## 2019-12-17 DIAGNOSIS — Z8673 Personal history of transient ischemic attack (TIA), and cerebral infarction without residual deficits: Secondary | ICD-10-CM

## 2019-12-17 DIAGNOSIS — Z8709 Personal history of other diseases of the respiratory system: Secondary | ICD-10-CM

## 2019-12-17 DIAGNOSIS — Z79890 Hormone replacement therapy (postmenopausal): Secondary | ICD-10-CM

## 2019-12-17 DIAGNOSIS — D62 Acute posthemorrhagic anemia: Secondary | ICD-10-CM

## 2019-12-17 DIAGNOSIS — Z7982 Long term (current) use of aspirin: Secondary | ICD-10-CM

## 2019-12-17 DIAGNOSIS — L8990 Pressure ulcer of unspecified site, unspecified stage: Secondary | ICD-10-CM | POA: Insufficient documentation

## 2019-12-17 LAB — COMPREHENSIVE METABOLIC PANEL
ALT: 36 U/L (ref 0–44)
AST: 16 U/L (ref 15–41)
Albumin: 1 g/dL — ABNORMAL LOW (ref 3.5–5.0)
Alkaline Phosphatase: 84 U/L (ref 38–126)
Anion gap: 6 (ref 5–15)
BUN: 25 mg/dL — ABNORMAL HIGH (ref 8–23)
CO2: 18 mmol/L — ABNORMAL LOW (ref 22–32)
Calcium: 5.3 mg/dL — CL (ref 8.9–10.3)
Chloride: 125 mmol/L — ABNORMAL HIGH (ref 98–111)
Creatinine, Ser: 0.91 mg/dL (ref 0.44–1.00)
GFR calc Af Amer: >60 mL/min (ref >60)
GFR calc non Af Amer: >60 mL/min (ref >60)
Glucose, Bld: 94 mg/dL (ref 70–99)
Potassium: 2.3 mmol/L — CL (ref 3.5–5.1)
Sodium: 149 mmol/L — ABNORMAL HIGH (ref 135–145)
Total Bilirubin: 0.7 mg/dL (ref 0.3–1.2)
Total Protein: 3.2 g/dL — ABNORMAL LOW (ref 6.5–8.1)

## 2019-12-17 LAB — BASIC METABOLIC PANEL
Anion gap: 8 (ref 5–15)
BUN: 31 mg/dL — ABNORMAL HIGH (ref 8–23)
CO2: 21 mmol/L — ABNORMAL LOW (ref 22–32)
Calcium: 7.1 mg/dL — ABNORMAL LOW (ref 8.9–10.3)
Chloride: 119 mmol/L — ABNORMAL HIGH (ref 98–111)
Creatinine, Ser: 1.2 mg/dL — ABNORMAL HIGH (ref 0.44–1.00)
GFR calc Af Amer: 54 mL/min — ABNORMAL LOW (ref >60)
GFR calc non Af Amer: 47 mL/min — ABNORMAL LOW (ref >60)
Glucose, Bld: 91 mg/dL (ref 70–99)
Potassium: 4.2 mmol/L (ref 3.5–5.1)
Sodium: 148 mmol/L — ABNORMAL HIGH (ref 135–145)

## 2019-12-17 LAB — BPAM RBC
Blood Product Expiration Date: 202103222359
Blood Product Expiration Date: 202103232359
ISSUE DATE / TIME: 202102271601
ISSUE DATE / TIME: 202102272247
Unit Type and Rh: 6200
Unit Type and Rh: 6200

## 2019-12-17 LAB — TYPE AND SCREEN
ABO/RH(D): A POS
Antibody Screen: NEGATIVE
Unit division: 0
Unit division: 0

## 2019-12-17 LAB — MAGNESIUM
Magnesium: 1 mg/dL — ABNORMAL LOW (ref 1.7–2.4)
Magnesium: 2.1 mg/dL (ref 1.7–2.4)

## 2019-12-17 LAB — CBC
HCT: 27 % — ABNORMAL LOW (ref 36.0–46.0)
HCT: 30.5 % — ABNORMAL LOW (ref 36.0–46.0)
Hemoglobin: 9 g/dL — ABNORMAL LOW (ref 12.0–15.0)
Hemoglobin: 10.1 g/dL — ABNORMAL LOW (ref 12.0–15.0)
MCH: 30.1 pg (ref 26.0–34.0)
MCH: 30.2 pg (ref 26.0–34.0)
MCHC: 33.3 g/dL (ref 30.0–36.0)
MCHC: 33.1 g/dL (ref 30.0–36.0)
MCV: 90.3 fL (ref 80.0–100.0)
MCV: 91.3 fL (ref 80.0–100.0)
Platelets: 112 10*3/uL — ABNORMAL LOW (ref 150–400)
Platelets: 123 10*3/uL — ABNORMAL LOW (ref 150–400)
RBC: 2.99 MIL/uL — ABNORMAL LOW (ref 3.87–5.11)
RBC: 3.34 MIL/uL — ABNORMAL LOW (ref 3.87–5.11)
RDW: 15.8 % — ABNORMAL HIGH (ref 11.5–15.5)
RDW: 16.3 % — ABNORMAL HIGH (ref 11.5–15.5)
WBC: 12.3 10*3/uL — ABNORMAL HIGH (ref 4.0–10.5)
WBC: 14.1 10*3/uL — ABNORMAL HIGH (ref 4.0–10.5)
nRBC: 0.5 % — ABNORMAL HIGH (ref 0.0–0.2)
nRBC: 0.6 % — ABNORMAL HIGH (ref 0.0–0.2)

## 2019-12-17 LAB — HIV ANTIBODY (ROUTINE TESTING W REFLEX): HIV Screen 4th Generation wRfx: NONREACTIVE

## 2019-12-17 LAB — PHOSPHORUS: Phosphorus: 3.5 mg/dL (ref 2.5–4.6)

## 2019-12-17 LAB — MRSA PCR SCREENING: MRSA by PCR: NEGATIVE

## 2019-12-17 LAB — GLUCOSE, CAPILLARY
Glucose-Capillary: 97 mg/dL (ref 70–99)
Glucose-Capillary: 90 mg/dL (ref 70–99)
Glucose-Capillary: 81 mg/dL (ref 70–99)
Glucose-Capillary: 76 mg/dL (ref 70–99)

## 2019-12-17 MED ORDER — POTASSIUM CHLORIDE 20 MEQ PO PACK
40.0000 meq | PACK | Freq: Two times a day (BID) | ORAL | Status: AC
  Administered 2019-12-17 (×2): 40 meq
  Filled 2019-12-17 (×2): qty 2

## 2019-12-17 MED ORDER — CHLORHEXIDINE GLUCONATE CLOTH 2 % EX PADS
6.0000 | MEDICATED_PAD | Freq: Every day | CUTANEOUS | Status: DC
  Administered 2019-12-17 – 2019-12-19 (×2): 6 via TOPICAL

## 2019-12-17 MED ORDER — SODIUM CHLORIDE 0.9 % IV SOLN
3.0000 g | Freq: Four times a day (QID) | INTRAVENOUS | Status: DC
  Administered 2019-12-17 – 2019-12-19 (×9): 3 g via INTRAVENOUS
  Filled 2019-12-17: qty 3
  Filled 2019-12-17 (×2): qty 8
  Filled 2019-12-17: qty 3
  Filled 2019-12-17: qty 8
  Filled 2019-12-17: qty 3
  Filled 2019-12-17 (×3): qty 8
  Filled 2019-12-17: qty 3
  Filled 2019-12-17: qty 8

## 2019-12-17 MED ORDER — CALCIUM GLUCONATE-NACL 2-0.675 GM/100ML-% IV SOLN
2.0000 g | Freq: Once | INTRAVENOUS | Status: DC
  Filled 2019-12-17: qty 100

## 2019-12-17 MED ORDER — TECHNETIUM TC 99M-LABELED RED BLOOD CELLS IV KIT
25.7000 | PACK | Freq: Once | INTRAVENOUS | Status: AC | PRN
  Administered 2019-12-17: 25.7 via INTRAVENOUS

## 2019-12-17 MED ORDER — POTASSIUM CHLORIDE 10 MEQ/100ML IV SOLN
10.0000 meq | INTRAVENOUS | Status: AC
  Administered 2019-12-17 (×4): 10 meq via INTRAVENOUS
  Filled 2019-12-17 (×6): qty 100

## 2019-12-17 MED ORDER — MAGNESIUM SULFATE 4 GM/100ML IV SOLN
4.0000 g | Freq: Once | INTRAVENOUS | Status: AC
  Administered 2019-12-17: 12:00:00 4 g via INTRAVENOUS
  Filled 2019-12-17: qty 100

## 2019-12-17 NOTE — Progress Notes (Signed)
Patient's daughter, Dois Davenport, called RN requesting and update on patient. Dois Davenport was updated on plan of care and visiting restrictions. All questions and concerns answered.

## 2019-12-17 NOTE — Consult Note (Signed)
Reason for Consult: GI bleed Referring Physician: ER physician and hospital team  Jill Carlson is an 69 y.o. female.  HPI: Patient seen and examined and discussed with the ER physician in her hospital computer chart reviewed as well as some of her history from care everywhere at about a month ago she did have some bleeding from her PEG tube and it looks like she had a colonoscopy a few years ago with some small polyps but no obvious mention of diverticuli and she has been having a little bright red blood with her bowel movement since she has been here and was transferred to the ER when her hemoglobin dropped a little we are consulted for further work-up and plans and I discussed her case with her nurse assistant on the floor who said she did have a red bowel movement when she first got there but none since and she has not had her nuclear bleeding scan  Past Medical History:  Diagnosis Date  . COVID-19   . CVA (cerebral vascular accident) (HCC)   . Diabetes mellitus without complication (HCC)   . Pulmonary embolism (HCC)     History reviewed. No pertinent surgical history.  History reviewed. No pertinent family history.  Social History:  has an unknown smoking status. She has never used smokeless tobacco. She reports that she does not drink alcohol or use drugs.  Allergies:  Allergies  Allergen Reactions  . Codeine   . Vicodin [Hydrocodone-Acetaminophen]     Medications: I have reviewed the patient's current medications.  Results for orders placed or performed during the hospital encounter of 12/16/19 (from the past 48 hour(s))  Comprehensive metabolic panel     Status: Abnormal   Collection Time: 12/16/19  2:18 PM  Result Value Ref Range   Sodium 149 (H) 135 - 145 mmol/L   Potassium 3.3 (L) 3.5 - 5.1 mmol/L   Chloride 115 (H) 98 - 111 mmol/L   CO2 24 22 - 32 mmol/L   Glucose, Bld 122 (H) 70 - 99 mg/dL    Comment: Glucose reference range applies only to samples taken after  fasting for at least 8 hours.   BUN 39 (H) 8 - 23 mg/dL   Creatinine, Ser 6.96 (H) 0.44 - 1.00 mg/dL   Calcium 7.1 (L) 8.9 - 10.3 mg/dL   Total Protein 3.6 (L) 6.5 - 8.1 g/dL   Albumin 1.2 (L) 3.5 - 5.0 g/dL   AST 19 15 - 41 U/L   ALT 47 (H) 0 - 44 U/L   Alkaline Phosphatase 115 38 - 126 U/L   Total Bilirubin 0.8 0.3 - 1.2 mg/dL   GFR calc non Af Amer 45 (L) >60 mL/min   GFR calc Af Amer 53 (L) >60 mL/min   Anion gap 10 5 - 15    Comment: Performed at [facility], [address] [place], [place] [address]  Lipase, blood     Status: None   Collection Time: 12/16/19  2:18 PM  Result Value Ref Range   Lipase 35 11 - 51 U/L    Comment: Performed at [facility], [address], [place], [place] [address]  Lactic acid, plasma     Status: None   Collection Time: 12/16/19  2:18 PM  Result Value Ref Range   Lactic Acid, Venous 1.1 0.5 - 1.9 mmol/L    Comment: Performed at [facility], [address] [place], [place] [address]  CBC with Differential  Status: Abnormal   Collection Time: 12/16/19  2:18 PM  Result Value Ref Range   WBC 9.8 4.0 - 10.5 K/uL   RBC 1.98 (L) 3.87 - 5.11 MIL/uL   Hemoglobin 6.0 (LL) 12.0 - 15.0 g/dL    Comment: REPEATED TO VERIFY CRITICAL VALUE NOTED.  VALUE IS CONSISTENT WITH PREVIOUSLY REPORTED AND CALLED VALUE.    HCT 19.2 (L) 36.0 - 46.0 %   MCV 97.0 80.0 - 100.0 fL   MCH 30.3 26.0 - 34.0 pg   MCHC 31.3 30.0 - 36.0 g/dL   RDW 84.6 (H) 65.9 - 93.5 %   Platelets 102 (L) 150 - 400 K/uL    Comment: REPEATED TO VERIFY Immature Platelet Fraction may be clinically indicated, consider ordering this additional test [id number] CONSISTENT WITH PREVIOUS RESULT    nRBC 0.3 (H) 0.0 - 0.2 %   Neutrophils Relative % 73 %   Neutro Abs 7.2 1.7 - 7.7 K/uL   Lymphocytes Relative 16 %   Lymphs Abs 1.6 0.7 - 4.0 K/uL   Monocytes Relative 7 %   Monocytes Absolute 0.7 0.1 - 1.0 K/uL   Eosinophils Relative 1 %   Eosinophils Absolute 0.1 0.0 -  0.5 K/uL   Basophils Relative 0 %   Basophils Absolute 0.0 0.0 - 0.1 K/uL   WBC Morphology MILD LEFT SHIFT (1-5% METAS, OCC MYELO, OCC BANDS)    Immature Granulocytes 3 %   Abs Immature Granulocytes 0.26 (H) 0.00 - 0.07 K/uL    Comment: Performed at [facility], [address] [place], [place] [address]  Protime-INR     Status: None   Collection Time: 12/16/19  2:18 PM  Result Value Ref Range   Prothrombin Time 15.2 11.4 - 15.2 seconds   INR 1.2 0.8 - 1.2    Comment: (NOTE) INR goal varies based on device and disease states. Performed at [facility], [address] [place], [place] [address]   Hemoglobin A1c     Status: None   Collection Time: 12/16/19  2:18 PM  Result Value Ref Range   Hgb A1c MFr Bld 5.4 4.8 - 5.6 %    Comment: (NOTE) Pre diabetes:          5.7%-6.4% Diabetes:              >6.4% Glycemic control for   <7.0% adults with diabetes    Mean Plasma Glucose 108.28 mg/dL    Comment: Performed at [facility], [address] [place], [place] [address]  Type and screen Ordered by PROVIDER DEFAULT     Status: None (Preliminary result)   Collection Time: 12/16/19  2:28 PM  Result Value Ref Range   ABO/RH(D) A POS    Antibody Screen NEG    Sample Expiration 12/19/2019,2359    Unit Number [id number]    Blood Component Type RED CELLS,LR    Unit division 00    Status of Unit ISSUED    Transfusion Status OK TO TRANSFUSE    Crossmatch Result      Compatible Performed at [facility], [address] [place], [place] [address]    Unit Number [id number]    Blood Component Type RED CELLS,LR    Unit division 00    Status of Unit ISSUED    Transfusion Status OK TO TRANSFUSE    Crossmatch Result Compatible   Prepare RBC     Status: None   Collection Time: 12/16/19  4:00 PM  Result Value Ref Range   Order Confirmation      ORDER PROCESSED BY BLOOD BANK Performed at [facility], [place] [address], [place], [place] [address]    Glucose, capillary     Status: Abnormal   Collection Time: 12/16/19  9:28 PM  Result Value Ref Range   Glucose-Capillary 125 (H) 70 - 99 mg/dL    Comment: Glucose reference range applies only to samples taken after fasting for at least 8 hours.    DG Chest Port 1 View  Result Date: 12/16/2019 CLINICAL DATA:  Tracheostomy with multiple medical complications. EXAM: PORTABLE CHEST 1 VIEW COMPARISON:  12/15/2019 FINDINGS: Tracheostomy tube and RIGHT PICC line again noted. Cardiomegaly, pulmonary vascular congestion with bilateral interstitial opacities, bibasilar opacities and probable effusions noted. There is no evidence of pneumothorax or acute bony abnormality. IMPRESSION: Little significant change with continued pulmonary vascular congestion with bilateral interstitial opacities/edema, bibasilar opacities and probable effusions. Electronically Signed   By: Margarette Canada M.D.   On: 12/16/2019 15:05   DG CHEST PORT 1 VIEW  Result Date: 12/15/2019 CLINICAL DATA:  PICC line placement. EXAM: PORTABLE CHEST 1 VIEW COMPARISON:  12/12/2019. FINDINGS: Interim placement of right PICC line. Tip at cavoatrial junction. Tracheostomy tube in stable position. Heart size normal. Diffuse bilateral pulmonary infiltrates/edema and bilateral pleural effusions. Similar findings noted on prior exam. No pneumothorax. No acute bony abnormality. IMPRESSION: 1. Interim placement of PICC line, its tip at the cavoatrial junction. Tracheostomy tube in stable position. 2. Diffuse bilateral pulmonary infiltrates/edema bilateral pleural effusions. Similar findings noted on prior exam. Electronically Signed   By: [place]   On: 12/15/2019 14:50    Review of Systems patient is nonverbal from her CVA Blood pressure 139/72, pulse 70, temperature 97.8 F (36.6 C), temperature source Oral, resp. rate 20, height 5\' 5"  (1.651 m), weight 94.7 kg, SpO2 100 %. Physical Exam exam pertinent for her abdomen being soft occasional  bowel sounds nontender supposedly her PEG was lavaged in the ER and no blood was returned labs reviewed BUN actually has been stable in this hospital and has been slowly decreasing as has her creatinine from [facility] reviewed  Assessment/Plan: GI bleed sounds lower with majority of the blood being bright red in patient with multiple medical issues Plan: Await bleeding scan continue to observe if bleeding scan negative and no signs of obvious bleeding can resume tube feeds or use clear liquid diet via PEG if available for a day or 2 just to be sure  Verner Kopischke E 12/17/2019, 3:41 AM

## 2019-12-17 NOTE — Progress Notes (Signed)
Date: 12/17/2019  Patient name: Jill Carlson  Medical record number: [id number]  Date of birth: 11-07-1950   I have seen and evaluated Jill Carlson and discussed their care with the Residency Team.  In brief, patient is [age]-year-old female with past medical history of right MCA CVA with hemorrhagic conversion, respiratory failure status post tracheostomy on 2 L O2, type 2 diabetes [facility].  Patient unable to provide history and this was obtained from the chart.  Patient was noted to have persistent rectal bleeding at [facility] with associated acute blood loss anemia with hemoglobin decreasing to the sixes.  Patient has [facility] as well as IV Protonix with no resolution of the bleed.  They have been unable to obtain a GI consult at the facility and transfer the patient to the hospital for further GI work-up.  Unable to obtain a review of systems as patient is nonverbal.  Of note, patient does have large bilateral pleural effusions with atelectasis some bilateral lower lobes as well as scattered groundglass airspace opacities in the right middle and lower lobes concerning for possible multifocal pneumonia.  Today patient remains nonverbal but does occasionally track with her eyes.  PMHx, Fam Hx, and/or Soc Hx : As per resident admit note  Vitals:   12/17/19 0748 12/17/19 0807  BP: 130/81   Pulse:  82  Resp:  (!) 25  Temp: 97.6 F (36.4 C)   SpO2:  99%   General: Eyes open, occasionally tracks with eyes but does not follow commands and is nonverbal CVS: Regular rate and rhythm, normal heart sounds Lungs: Decreased breath sounds at the bases, transmitted upper airway sounds which improved with suction Abdomen: Soft, nontender, nondistended, normoactive bowel sounds Extremities: 2+ bilateral lower extremity pitting edema noted, nontender to palpation HEENT:  Normocephalic, atraumatic, trach in place Neuro: Opens eyes and occasionally tracks with eyes but does not follow commands and is nonverbal Skin: Warm and dry  Assessment and Plan: I have seen and evaluated the patient as outlined above. I agree with the formulated Assessment and Plan as detailed in the residents' note, with the following changes:   1.  Acute blood loss anemia secondary to GI bleed: -Patient presented to the hospital with persistent rectal bleeding as well as recurrent anemia despite multiple blood transfusions from [facility].  Patient with likely lower GI bleed as PEG lavage was negative and patient with bloody bowel movements more consistent with a lower GI bleed although this could be secondary to brisk upper bleed. -GI follow-up and recommendations appreciated -We will follow-up results from bleeding scan.  If positive would consider IR consultation for possible embolization -If negative would resume tube feeds or start the patient on a clear liquid diet through the PEG -Continue with IV Protonix 40 mg twice daily -Patient's hemoglobin on admission was in the sixes which has improved to 9 following 2 units PRBC transfusion.  We will continue to monitor her CBC closely.  We will repeat CBC later this afternoon   2.  Multifocal pneumonia: -Patient is known to have right upper and and lower lobe infiltrates on her chest x-ray consistent with possible aspiration pneumonia.  Patient is remained afebrile but does have a leukocytosis up to 12.3 -We will DC cefepime and Flagyl and start the patient on Unasyn for now -Patient also noted to have bilateral pleural effusions and anasarca which I suspect is  secondary to her severe hypoalbuminemia.  Would consider thoracentesis on this admission especially if patient becomes febrile or has declining respiratory status.  Earl Lagos, MD 2/28/202111:57 AM

## 2019-12-17 NOTE — Progress Notes (Signed)
Patient off of floor around 0800 to go to nuclear medicine.

## 2019-12-17 NOTE — Progress Notes (Signed)
NAME:  Jill Carlson, MRN:  [id number], DOB:  Apr 24, 1951, LOS: 2 ADMISSION DATE:  12/16/2019, Primary: Patient, No Pcp Per  CHIEF COMPLAINT:  BRBR, anemia   Brief History  Jill Carlson is a 69 yo female with a PMH of CVA, chronic respiratory failure s/p tracheostomy placement, T2DM [facility]. On 2/27, she was sent to the [facility] for ongoing rectal bleeding and anemia (hgb ~ mid 6's) as it's reported that they were having difficulty obtaining a GI consult.    Presented to [facility] 11/02/19 with failure to thrive, worsened left sided weakness. MRI revealed recurrent right MCA and PCA ischemic strokes as well as remote infarcts involving the right basal ganglia, corona radiata, right parietal lobe, right thalmus and left pons. She was subsequently started on aspirin and Brilinta and continued on her home dose of plavix.  During her hospitalization, she developed hemorrhagic transformation of the right parietal/occipital stroke and she ultimately required intubation. A repeat head CT on 2/10 revealed large bilateral strokes. Her continued ventilator dependent respiratory failure resulted in requirement of a PEG tube (placed 11/21/19) and tracheostomy placement on 2/15. The etiology of her repeated strokes were believed to be related to afib as she was unable to be anticoagulated due to her hemmorhagic transformation. That hospitalization was complicated by septic shock, e.coli pneumonia and bacteremia, and persistent GI bleeding from her PEG site requiring multiple transfusions. At time of discharge, her neuro exam remained poor with preserved brainstem reflexes but no purposeful movement. Prognosis was discussed with her family who wished to wait to see if she recovered neurologically.  [facility] she was started on cefepime and flagyl on 2/26 for a multifocal pneumonia.  Subjective/Interm history  No  overnight events VSS  Significant Hospital Events   2/27 Admission 2/28 GI consult>>bleeding scan neg>>tube feeds restarted; 2>>5L supplemental O2 requirement  Objective   Blood pressure (!) 144/88, pulse 84, temperature 98 F (36.7 C), temperature source Oral, resp. rate 18, height 5\' 5"  (1.651 m), weight 96 kg, SpO2 99 %.     Intake/Output Summary (Last 24 hours) at 12/18/2019 0953 Last data filed at 12/18/2019 0900 Gross per 24 hour  Intake 1013.92 ml  Output 3550 ml  Net -2536.08 ml   Filed Weights   12/16/19 2043 12/17/19 0500 12/18/19 0630  Weight: 94.7 kg 95 kg 96 kg    Examination: GENERAL: chronically ill appearing HEENT: tracheostomy in place with copious mucous secretions. No surrounding erythema/edema CARDIAC: heart RRR. Diffuse anasarca PULMONARY: diffuse bilateral rhonchi ABDOMEN: PEG tub in place NEURO: not awake or alert. Does not awaken with sternal rub. non-purposeful eye movement. Does not withdraw to pain. Does not follow commands. No plantar reflex SKIN: purple discoloration in right lower quadrant of abdomen and skin fold crease. MSK: flacid limbs.  Consults:  GI Palliative care Significant Diagnostic Tests:  2/27 CXR>bilateral intstitial opacities/edema, bibasilar opacites, probable effusions, pulm vascular congestion  2/28 Bleeding scan>no sign of active bleeding  Antimicrobials:  Unasyn 2/28>>  Labs    CBC Latest Ref Rng & Units 12/18/2019 12/17/2019 12/17/2019  WBC 4.0 - 10.5 K/uL 13.6(H) 14.1(H) 12.3(H)  Hemoglobin 12.0 - 15.0 g/dL 12/19/2019) 10.1(L) 9.0(L)  Hematocrit 36.0 - 46.0 % 28.7(L) 30.5(L) 27.0(L)  Platelets 150 - 400 K/uL 147(L) 123(L) 112(L)   BMP Latest Ref Rng & Units 12/18/2019 12/17/2019 12/17/2019  Glucose 70 - 99 mg/dL 76 91 94  BUN 8 -  23 mg/dL 25(H) 31(H) 25(H)  Creatinine 0.44 - 1.00 mg/dL 1.10(H) 1.20(H) 0.91  Sodium 135 - 145 mmol/L 149(H) 148(H) 149(H)  Potassium 3.5 - 5.1 mmol/L 3.8 4.2 2.3(LL)  Chloride 98 - 111 mmol/L  117(H) 119(H) 125(H)  CO2 22 - 32 mmol/L 23 21(L) 18(L)  Calcium 8.9 - 10.3 mg/dL 7.4(L) 7.1(L) 5.3(LL)    Summary  69 yo female with 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  Assessment & Plan:  Active Problems:   GIB (gastrointestinal bleeding)   Pressure injury of skin  Acute GI Bleeding Acute Blood loss Anemia DDX should include angiodysplasia vs diverticular bleed vs coagulopathy. Chart review indicates that she has had several issues with bleeding from her PEG site. Given the nature of the bleed being bright red this time, suspect that this is lower GI.  PT/INR appropriate. If her family continues to wish for full scope of care, would require further follow up with colonoscopy to r/o neoplasm however given her overall poor prognosis, would not recommend this. Bleeding scan negative. Suspect that this is due to her GI bleed slowing down/stopping and hgb should stabilize over the next few days Hgb stable--9.4. Total transfusions: 2U PRBC transfused.  Appreciate GI recs Appreciate nutrition recs regarding tube feedings Plan Tube feeds restarted 2/28.   Continue protonix Continue to monitor hgb  Acute on Chronic respiratory failure s/p tracheostomy placement. 2L supplemental O2 requirement at baseline. Multifocal opacities. Infectious vs post covid vs pulm edema -Up to 5L requirement yesterday. -Leukocytosis stable from yesterday. Remains afebrile at this point. -Previously treated with cefepime and flagyl since 2/26 for suspected pneumonia etiology however was switched to Unasyn 2/28. May need to consider switching back to flagyl + cefepime for pseudomonal coverage if she does not improve over the next couple of days. Will also need to further evaluate for empyema if she declines  further. -Vascular congestion is also noted on CXR however echo obtained at [facility] was unrevealing. -She did also have COVID in early January. Post covid changes could be contributing to her symptoms however this is lower on my differential. Plan Unasyn day #2 Wean O2 as able  Large bilateral MCA/PCA ischemic strokes with hemmorhagic conversions. While previously hospitalized at [facility], her family had wanted to wait longer to see if she would undergo spontaneous neurological recovery. Unfortunately, given the length of time that it has been with no neurologic recovery evident, her overall neurologic prognosis is poor. Appreciate palliative care's assistance and will try to reach out to family myself later today.  T2DM. Glucose stable. Continue SSI  Decubitus ulcer on coccyx. Wound care on board. Continue santyl and frequent turning.   Best practice:  CODE STATUS: Full Diet: Tube feeds DVT for prophylaxis: SCDs Dispo: pending respiratory stabilization and palliative consult.   Mitzi Hansen, MD INTERNAL MEDICINE RESIDENT PGY-1 PAGER #: [phone] 12/18/19  9:53 AM

## 2019-12-17 NOTE — Progress Notes (Signed)
Patient returned to unit from nuclear medicine

## 2019-12-17 NOTE — Progress Notes (Addendum)
Subjective: HD#1 Overnight, no acute events reported.   This morning, patient evaluated at bedside. She is awake and does not appear to be in acute distress. She has sacadic eye movements during examination. Trach tube suctioned at bedside.   Objective:  Vital signs in last 24 hours: Vitals:   12/16/19 2312 12/17/19 0000 12/17/19 0153 12/17/19 0300  BP: 129/67 136/69 139/72 138/74  Pulse: 70 69 70 73  Resp: 19 17 20 18   Temp: 97.6 F (36.4 C)  97.8 F (36.6 C)   TempSrc:   Oral   SpO2:  100%  99%  Weight:      Height:       CBC Latest Ref Rng & Units 12/17/2019 12/16/2019 12/16/2019  WBC 4.0 - 10.5 K/uL 12.3(H) 9.8 10.6(H)  Hemoglobin 12.0 - 15.0 g/dL 9.0(L) 6.0(LL) 6.6(LL)  Hematocrit 36.0 - 46.0 % 27.0(L) 19.2(L) 20.6(L)  Platelets 150 - 400 K/uL 112(L) 102(L) 104(L)   CMP Latest Ref Rng & Units 12/17/2019 12/16/2019 12/16/2019  Glucose 70 - 99 mg/dL 94 122(H) 104(H)  BUN 8 - 23 mg/dL 25(H) 39(H) 34(H)  Creatinine 0.44 - 1.00 mg/dL 0.91 1.23(H) 1.31(H)  Sodium 135 - 145 mmol/L 149(H) 149(H) 150(H)  Potassium 3.5 - 5.1 mmol/L 2.3(LL) 3.3(L) 3.7  Chloride 98 - 111 mmol/L 125(H) 115(H) 117(H)  CO2 22 - 32 mmol/L 18(L) 24 24  Calcium 8.9 - 10.3 mg/dL 5.3(LL) 7.1(L) 7.1(L)  Total Protein 6.5 - 8.1 g/dL 3.2(L) 3.6(L) -  Total Bilirubin 0.3 - 1.2 mg/dL 0.7 0.8 -  Alkaline Phos 38 - 126 U/L 84 115 -  AST 15 - 41 U/L 16 19 -  ALT 0 - 44 U/L 36 47(H) -    Physical Exam  Constitutional: She appears not lethargic, to not be writhing in pain and not jaundiced. She appears unhealthy. No distress.  HENT:  Head: Normocephalic and atraumatic.  Eyes: Conjunctivae and EOM are normal. Right eye exhibits no discharge. Left eye exhibits no discharge. No scleral icterus.  Saccadic eye movements   Cardiovascular: Normal rate, regular rhythm, normal heart sounds and intact distal pulses.  No murmur heard. Pulmonary/Chest: Effort normal. No respiratory distress. She has rhonchi.    Rhoncherous breath sounds at bilateral mid-axillary auscultation  Patient on 5L O2 via trach collar and saturation 99%  Abdominal: Soft. Bowel sounds are normal. She exhibits no distension. There is no abdominal tenderness. There is no rebound.  PEG tube in place  Musculoskeletal:        General: Edema present. No deformity.     Comments: Generalized edema in bilateral upper and lower extremities  Neurological: She is alert. She appears not lethargic.  Skin: Skin is warm and dry. She is not diaphoretic.  Unstageable sacral ulcer per chart review; unable to turn patient for assessment at this time   Assessment/Plan: 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  GI bleed: Acute blood loss anemia:  Patient presented from facility for persistent GI bleed. Per records, patient has history of GI bleed via PEG tube during previous admission for which she required recurrent transfusions and was on Protonix with resolution. However, over the past few days, patient noted to have active bleeding per rectum with drop in Hb to <6. She received multiple transfusions and IV protonix gtt at  facility; however, continued to have active bleeding. Patient had Hb 6.6 on presentation for which she was given 2U pRBC with appropriate response. No blood noted on PEG tube lavage. Overnight, patient noted to have a bowel movement with red blood noted. CBC this AM with Hb 9.0 - GI consulted, appreciate their recommendations - F/u nuclear bleeding scan for possible IR consult if active bleeding noted - IV Protonix 40mg  bid - CBC in PM  - Transfuse prn for Hb>7 - CBC daily  - Continuous cardiac monitoring  Acute respiratory failure s/p tracheostomy: Possible multifocal pneumonia:  Patient with history of acute  respiratory failure s/p tracheostomy on 2L O2 at baseline. She was started on cefepime and flagyl on 2/26 at facility for CT findings suggestive of multifocal pneumonia at right lung base and right middle lobe. On examination, patient noted to have rhonchi at bilateral bases. She is on 5L O2 via trach and saturating 99%. WBC 12.3 this AM. Given that her multifocal pneumonia is predominantly on right, more suggestive of aspiration pneumonia. Will discontinue cefepime and flagyl at this point and start on Unasyn.  - IV Unasyn 3g q6h day 1/7 - CBC daily  - Trach care per RN  R MCA stroke with hemorrhagic conversion:  Patient with history of recurrent right MCA CVA with hemorrhagic transformation of right parietal/occipital stroke on 11/25/19 when started on Aspirin and Brilinta in addition to home plavix. She had persistently poor neurologic exam with repeat CT on 11/29/19 with large bilateral strokes in setting of atrial fibrillation. Patient's neurologic exam with preserved brainstem reflexes with eye opening but no purposeful movement or following commands. Per outside hospital notes, family wants to give patient time for neurologic recovery.  On examination, patient noted to be awake and alert. She has saccadic eye movements to the right but otherwise does not show any purposeful movement. She does not follow commands. Unable to assess complete neurologic exam at this time.  - Continue goals of care discussion with family - Palliative care consulted, appreciate their assistance - Continue supportive treatment   Hypertension: Patient is on amlodipine 10mg  daily, labetalol 200mg  twice daily and lasix 40mg  twice daily at facility.  - Holding antihypertensives in setting of ongoing GI bleed   Atrial fibrillation:  Patient noted to have A.fib with RVR that was presumed to be contributing to her recurrent R MCA strokes on prior admission. CHA2DS2-VASc score 5, HAS-BLED 4. Given that she previously had  hemorrhagic conversion of ischemic stroke, patient is at high risk for bleeding on anticoagulation. She is currently in normal sinus rhythm and rate controlled.  - Continuous cardiac monitoring  Anasarca: Patient with diffuse anasarca in bilateral upper and lower extremities. She is on Lasix 40mg  twice daily at facility.  - Continue IV Lasix 40mg  twice daily  - Strict I&O - BMP daily   Sacral ulcer:  Per chart review, patient has an unstageable sacral ulcer. EDP also noted early breakdown around sacrum symmetrically without active bleeding, purulence or drainage. Unable to turn patient at this time to assess.  - Wound care consult, appreciate their assistance  DM II:  HbA1c 5.4 Patient is on Humalog 1U q6h at her facility. - CBG monitoring - SSI   Hypothyroidism:  - Levothyroxine 01/27/20 daily   FEN/GI: Diet: NPO Fluids: None  Electrolytes: Monitor and replete prn   VTE Prophylaxis: SCDs  Code status: FULL   Prior to Admission [facility]  Barriers  to Discharge: Continued medical management  Dispo: Anticipated discharge pending clinical improvement.   Eliezer Bottom, MD  Internal Medicine, PGY-1 Pager: [phone] 12/17/2019, 6:25 AM

## 2019-12-18 ENCOUNTER — Encounter (HOSPITAL_COMMUNITY): Payer: Self-pay | Admitting: Internal Medicine

## 2019-12-18 DIAGNOSIS — J189 Pneumonia, unspecified organism: Secondary | ICD-10-CM

## 2019-12-18 DIAGNOSIS — Z7189 Other specified counseling: Secondary | ICD-10-CM

## 2019-12-18 DIAGNOSIS — J9600 Acute respiratory failure, unspecified whether with hypoxia or hypercapnia: Secondary | ICD-10-CM

## 2019-12-18 DIAGNOSIS — R69 Illness, unspecified: Secondary | ICD-10-CM

## 2019-12-18 DIAGNOSIS — Z8616 Personal history of COVID-19: Secondary | ICD-10-CM

## 2019-12-18 DIAGNOSIS — R918 Other nonspecific abnormal finding of lung field: Secondary | ICD-10-CM

## 2019-12-18 DIAGNOSIS — D62 Acute posthemorrhagic anemia: Secondary | ICD-10-CM

## 2019-12-18 DIAGNOSIS — Z515 Encounter for palliative care: Secondary | ICD-10-CM

## 2019-12-18 DIAGNOSIS — K922 Gastrointestinal hemorrhage, unspecified: Secondary | ICD-10-CM

## 2019-12-18 DIAGNOSIS — J9620 Acute and chronic respiratory failure, unspecified whether with hypoxia or hypercapnia: Secondary | ICD-10-CM

## 2019-12-18 DIAGNOSIS — L89159 Pressure ulcer of sacral region, unspecified stage: Secondary | ICD-10-CM

## 2019-12-18 LAB — BASIC METABOLIC PANEL
Anion gap: 9 (ref 5–15)
BUN: 25 mg/dL — ABNORMAL HIGH (ref 8–23)
CO2: 23 mmol/L (ref 22–32)
Calcium: 7.4 mg/dL — ABNORMAL LOW (ref 8.9–10.3)
Chloride: 117 mmol/L — ABNORMAL HIGH (ref 98–111)
Creatinine, Ser: 1.1 mg/dL — ABNORMAL HIGH (ref 0.44–1.00)
GFR calc Af Amer: >60 mL/min (ref >60)
GFR calc non Af Amer: 52 mL/min — ABNORMAL LOW (ref >60)
Glucose, Bld: 76 mg/dL (ref 70–99)
Potassium: 3.8 mmol/L (ref 3.5–5.1)
Sodium: 149 mmol/L — ABNORMAL HIGH (ref 135–145)

## 2019-12-18 LAB — CBC
HCT: 28.7 % — ABNORMAL LOW (ref 36.0–46.0)
HCT: 30.4 % — ABNORMAL LOW (ref 36.0–46.0)
Hemoglobin: 9.4 g/dL — ABNORMAL LOW (ref 12.0–15.0)
Hemoglobin: 9.8 g/dL — ABNORMAL LOW (ref 12.0–15.0)
MCH: 30.5 pg (ref 26.0–34.0)
MCH: 30.1 pg (ref 26.0–34.0)
MCHC: 32.8 g/dL (ref 30.0–36.0)
MCHC: 32.2 g/dL (ref 30.0–36.0)
MCV: 93.2 fL (ref 80.0–100.0)
MCV: 93.3 fL (ref 80.0–100.0)
Platelets: 147 10*3/uL — ABNORMAL LOW (ref 150–400)
Platelets: 172 10*3/uL (ref 150–400)
RBC: 3.08 MIL/uL — ABNORMAL LOW (ref 3.87–5.11)
RBC: 3.26 MIL/uL — ABNORMAL LOW (ref 3.87–5.11)
RDW: 16.5 % — ABNORMAL HIGH (ref 11.5–15.5)
RDW: 16.8 % — ABNORMAL HIGH (ref 11.5–15.5)
WBC: 13.6 10*3/uL — ABNORMAL HIGH (ref 4.0–10.5)
WBC: 14.1 10*3/uL — ABNORMAL HIGH (ref 4.0–10.5)
nRBC: 0.5 % — ABNORMAL HIGH (ref 0.0–0.2)
nRBC: 0.4 % — ABNORMAL HIGH (ref 0.0–0.2)

## 2019-12-18 LAB — PHOSPHORUS: Phosphorus: 3.5 mg/dL (ref 2.5–4.6)

## 2019-12-18 LAB — GLUCOSE, CAPILLARY
Glucose-Capillary: 111 mg/dL — ABNORMAL HIGH (ref 70–99)
Glucose-Capillary: 118 mg/dL — ABNORMAL HIGH (ref 70–99)
Glucose-Capillary: 119 mg/dL — ABNORMAL HIGH (ref 70–99)
Glucose-Capillary: 147 mg/dL — ABNORMAL HIGH (ref 70–99)
Glucose-Capillary: 67 mg/dL — ABNORMAL LOW (ref 70–99)
Glucose-Capillary: 86 mg/dL (ref 70–99)

## 2019-12-18 LAB — MAGNESIUM: Magnesium: 1.8 mg/dL (ref 1.7–2.4)

## 2019-12-18 MED ORDER — COLLAGENASE 250 UNIT/GM EX OINT
TOPICAL_OINTMENT | Freq: Every day | CUTANEOUS | Status: DC
  Filled 2019-12-18: qty 30

## 2019-12-18 MED ORDER — FREE WATER
150.0000 mL | Status: DC
  Administered 2019-12-18 – 2019-12-19 (×7): 150 mL

## 2019-12-18 MED ORDER — OSMOLITE 1.2 CAL PO LIQD
1000.0000 mL | ORAL | Status: DC
  Filled 2019-12-18: qty 1000

## 2019-12-18 MED ORDER — PRO-STAT SUGAR FREE PO LIQD
30.0000 mL | Freq: Two times a day (BID) | ORAL | Status: DC
  Administered 2019-12-18 – 2019-12-19 (×3): 30 mL
  Filled 2019-12-18: qty 30

## 2019-12-18 MED ORDER — OSMOLITE 1.5 CAL PO LIQD
1000.0000 mL | ORAL | Status: DC
  Administered 2019-12-18 – 2019-12-19 (×2): 1000 mL
  Filled 2019-12-18 (×4): qty 1000

## 2019-12-18 MED ORDER — DEXTROSE 50 % IV SOLN
1.0000 | Freq: Once | INTRAVENOUS | Status: AC
  Administered 2019-12-18: 12:00:00 50 mL via INTRAVENOUS
  Filled 2019-12-18: qty 50

## 2019-12-18 NOTE — Progress Notes (Signed)
Dois Davenport, pt's daughter, updated. Discussed patient's pneumonia and high likelihood for repeat infections in her current state of being bedbound.   She also inquired about her neurological status. We discussed the poor prognosis given the length of time since the initial insult with little to no neurological recovery. I encouraged to her start thinking and discussing with other family members what would be in Porshia's best interest moving forward.  We also discussed discharge plans which I relayed to her would be discharge tomorrow pending and further significant decline.  [facility] however I'm unsure if she will need to go through the admission process and if there is any long standing benefit of continued rehabilitation given her poor neurological state. Message sent to social work for their input.  Elige Radon, MD

## 2019-12-18 NOTE — Progress Notes (Signed)
Around 1630: saw pt have blood around vaginal area. Paged MD, and she came to checked on pt. Per MD keep monitor pt after cleaning her up.  Checked on pt around 1845, still see pt has blood around vaginal area

## 2019-12-18 NOTE — Consult Note (Signed)
WOC Nurse Consult Note: Patient receiving care in [facility] 3E25.  Assisted with positioning for wound eval be her primary RN. Reason for Consult: sacral pressure injury Wound type: unstageable PI to coccyx and portions of left buttock, other areas more a stage 2. Pressure Injury POA: Yes Measurement: total areas measure 8 cm x 10 cm Wound bed: primarily pink and consistent with a stage 2.  However, the coccyx and scattered areas of left buttock are unstageable Drainage (amount, consistency, odor) none Periwound: intact Dressing procedure/placement/frequency:  Apply Santyl to the coccyx, buttocks wounds in a nickel thick layer. Cover with a saline moistened gauze, then dry gauze or ABD pad.  Change daily. The patient is already on an air mattress. Monitor the wound area(s) for worsening of condition such as: Signs/symptoms of infection,  Increase in size,  Development of or worsening of odor, Development of pain, or increased pain at the affected locations.  Notify the medical team if any of these develop.  Thank you for the consult.  Discussed plan of care with the bedside nurse.  WOC nurse will not follow at this time.  Please re-consult the WOC team if needed.  Helmut Muster, RN, MSN, CWOCN, CNS-BC, pager [phone]

## 2019-12-18 NOTE — Consult Note (Signed)
Consultation Note Date: 12/18/2019   Patient Name: Jill Carlson  DOB: [birth year]  MRN: [id number]  Age / Sex: 69 y.o., female  PCP: Patient, No Pcp Per Referring Physician: Lucious Groves, DO  Reason for Consultation: Establishing goals of care  HPI/Patient Profile: 69 y.o. female  with past medical history of CVA (R MCA ichemic stroke with hemorrhage conversion), respiratory failure with tracheostomy in place on 2L O2, diabetes mellitus presenting from [facility] for evaluation of ongoing rectal bleed admitted on 12/16/2019 with acute GI bleed/acute on chronic respiratory failure status post tracheostomy.   Clinical Assessment and Goals of Care: Jill Carlson is lying quietly in bed.  She does not respond to voice or hard sternal rub.  She does not withdraw to pain.  She is unable to make her basic needs known.  There is no family at bedside at this time.  Conference with bedside nursing staff related to patient condition, needs.  Call to daughter, Katharine Look, unable to leave voicemail message.  PMT to continue to follow  [place] -spouse , Florentine Diekman.  Daughter, Ronita Hipps.    SUMMARY OF RECOMMENDATIONS   At this point full scope/full code by default  Code Status/Advance Care Planning:  Full code  Symptom Management:   Per attending, no additional needs at this time.  Palliative Prophylaxis:   Oral Care and Turn Reposition  Additional Recommendations (Limitations, Scope, Preferences):  Full Scope Treatment  Psycho-social/Spiritual:   Desire for further Chaplaincy support:no  Additional Recommendations: Caregiving  Support/Resources  Prognosis:   Unable to determine, based on outcomes.  Guarded.  [facility] Planning[facility]      Primary Diagnoses: Present on Admission: . GIB (gastrointestinal bleeding)   I have  reviewed the medical record, interviewed the patient and family, and examined the patient. The following aspects are pertinent.  Past Medical History:  Diagnosis Date  . COVID-19   . CVA (cerebral vascular accident) ([place])   . Diabetes mellitus without complication ([place])   . Pulmonary embolism [facility])    Social History   Socioeconomic History  . Marital status: Married    Spouse name: Not on file  . Number of children: Not on file  . Years of education: Not on file  . Highest education level: Not on file  Occupational History  . Not on file  Tobacco Use  . Smoking status: Unknown If Ever Smoked  . Smokeless tobacco: Never Used  Substance and Sexual Activity  . Alcohol use: Never  . Drug use: Never  . Sexual activity: Not on file  Other Topics Concern  . Not on file  Social History Narrative  . Not on file   Social Determinants of Health   Financial Resource Strain:   . Difficulty of Paying Living Expenses: Not on file  Food Insecurity:   . Worried About Charity fundraiser in the Last Year: Not on file  . Ran Out of Food in the Last  Year: Not on file  Transportation Needs:   . Lack of Transportation (Medical): Not on file  . Lack of Transportation (Non-Medical): Not on file  Physical Activity:   . Days of Exercise per Week: Not on file  . Minutes of Exercise per Session: Not on file  Stress:   . Feeling of Stress : Not on file  Social Connections:   . Frequency of Communication with Friends and Family: Not on file  . Frequency of Social Gatherings with Friends and Family: Not on file  . Attends Religious Services: Not on file  . Active Member of Clubs or Organizations: Not on file  . Attends Banker Meetings: Not on file  . Marital Status: Not on file   History reviewed. No pertinent family history. Scheduled Meds: . Chlorhexidine Gluconate Cloth  6 each Topical Daily  . collagenase   Topical Daily  . feeding supplement (PRO-STAT SUGAR FREE 64)  30  mL Per Tube BID  . free water  150 mL Per Tube Q4H  . insulin aspart  0-15 Units Subcutaneous TID WC  . levothyroxine  75 mcg Oral Q0600  . pantoprazole (PROTONIX) IV  40 mg Intravenous Q12H   Continuous Infusions: . ampicillin-sulbactam (UNASYN) IV 3 g (12/18/19 1635)  . feeding supplement (OSMOLITE 1.5 CAL) 1,000 mL (12/18/19 1306)   PRN Meds:. Medications Prior to Admission:  Prior to Admission medications   Medication Sig Start Date End Date Taking? Authorizing Provider  amantadine (SYMMETREL) 100 MG capsule Give 100 mg by tube in the morning and at bedtime. 12/12/19  Yes [provider]  amLODipine (NORVASC) 10 MG tablet Give 10 mg by tube daily. 12/13/19  Yes [provider]  ascorbic acid (VITAMIN C) 500 MG tablet Take 500 mg by mouth daily.   Yes [provider]  insulin lispro (HUMALOG) 100 UNIT/ML injection Inject 1 Units into the skin every 6 (six) hours.   Yes [provider]  labetalol (NORMODYNE) 200 MG tablet Take 200 mg by mouth 2 (two) times daily.   Yes [provider]  levothyroxine (SYNTHROID) 75 MCG tablet Place 75 mcg into feeding tube daily. 02/23/19  Yes [provider]  Melatonin 3 MG TABS Take 1 tablet by mouth at bedtime.   Yes [provider]  modafinil (PROVIGIL) 100 MG tablet Take 100 mg by mouth daily.   Yes [provider]  Vitamin D, Cholecalciferol, 25 MCG (1000 UT) TABS Take by mouth.   Yes [provider]   Allergies  Allergen Reactions  . Codeine   . Vicodin [Hydrocodone-Acetaminophen]    Review of Systems  Unable to perform ROS: Patient unresponsive    Physical Exam Vitals and nursing note reviewed.  Constitutional:      General: She is not in acute distress.    Appearance: She is obese. She is ill-appearing.     Comments: Appears acutely/chronically ill, not toxic.  Does not respond  Cardiovascular:     Rate and Rhythm: Normal rate.  Pulmonary:     Effort:  Pulmonary effort is normal.     Comments: Trach collar Skin:    General: Skin is warm and dry.  Neurological:     Comments: Does not respond to hard sternal rub     Vital Signs: BP (!) 146/73   Pulse 97   Temp 98 F (36.7 C) (Oral)   Resp (!) 21   Ht 5\' 5"  (1.651 m)   Wt 96 kg  SpO2 98%   BMI 35.22 kg/m  Pain Scale: Faces       SpO2: SpO2: 98 % O2 Device:SpO2: 98 % O2 Flow Rate: .O2 Flow Rate (L/min): 5 L/min  IO: Intake/output summary:   Intake/Output Summary (Last 24 hours) at 12/18/2019 1710 Last data filed at 12/18/2019 1359 Gross per 24 hour  Intake 200 ml  Output 2200 ml  Net -2000 ml    LBM: Last BM Date: 12/18/19 Baseline Weight: Weight: 103.4 kg Most recent weight: Weight: 96 kg     Palliative Assessment/Data:   Flowsheet Rows     Most Recent Value  Intake Tab  Referral Department  Hospitalist  Unit at Time of Referral  Intermediate Care Unit  Palliative Care Primary Diagnosis  Pulmonary  Date Notified  12/17/19  Palliative Care Type  New Palliative care  Reason for referral  Clarify Goals of Care  Date of Admission  12/16/19  Date first seen by Palliative Care  12/18/19  # of days Palliative referral response time  1 Day(s)  # of days IP prior to Palliative referral  1  Clinical Assessment  Palliative Performance Scale Score  10%  Pain Max last 24 hours  Not able to report  Pain Min Last 24 hours  Not able to report  Dyspnea Max Last 24 Hours  Not able to report  Dyspnea Min Last 24 hours  Not able to report  Psychosocial & Spiritual Assessment  Palliative Care Outcomes      Time In: 1420 Time Out: 1450 Time Total: 30 minutes  Greater than 50%  of this time was spent counseling and coordinating care related to the above assessment and plan.  Signed by: Katheran Awe, NP   Please contact Palliative Medicine Team phone at [phone] for questions and concerns.  For individual provider: See Loretha Stapler

## 2019-12-18 NOTE — Progress Notes (Signed)
Patient is stable.  Per discussion with nurse, there was one episode last night, and one episode this morning, of a small amount of serosanguineous blood tingeing of brown stool.  After transfusion of 2 units of packed cells a couple of days ago, the patient's hemoglobin went from 6 to 9 and today it is stable at 9.4.  During the same time, her BUN has dropped from 39 to 25.  At this time, the abdomen is nontender, the PEG tube does not have any evident reflux of bloody fluid, and there is no evident stool or blood on the pad in the bed.  The patient is noncommunicative but not in any distress. Vitals are normal.  Impression: Minimal hematochezia superimposed on recent more significant GI tract blood loss necessitating transfusion.  Plan:   1.  18-minute conversation on the telephone with patient's husband and daughter, the latter of whom works in healthcare and is quite informed.  I went over the option of observation versus endoscopic and colonoscopic evaluation, quoting a roughly 1% risk of significant complications from either the prep or the procedures (including possible aspiration or exacerbation of bedsores, as well as the usual risks of anesthesia, perforation, and bleeding.  I explained that at present, the patient does not show signs of active bleeding, and that it is uncertain whether doing endoscopy and colonoscopy would give [place] a specific diagnosis and in particular a treatable condition that would affect management.  For now, both the patient's husband and daughter are comfortable with the course of watchful waiting.  If there is evidence of further significant rebleeding, we will revisit the issue, but for now, she "has been through so much" that there is a reluctance to put her through more tests that are not necessarily necessary or helpful.    2.  The patient's daughter has asked to be called with an update concerning the patient's pneumonia and neurologic status.  I explained that  this would be the responsibility of the attending ward team, whom she can have paged if they do not reach out to her sooner (I will attempt to reach them).  3.  We will continue to follow the patient with you from the GI tract standpoint.  Jill Carlson, M.D. Pager [phone] If no answer or after 5 PM call [phone]

## 2019-12-18 NOTE — Progress Notes (Signed)
Initial Nutrition Assessment  DOCUMENTATION CODES:   Obesity unspecified  INTERVENTION:   -Initiate Osmolite 1.5 @ 50 ml/hr via PEG  30 ml Prostat BID.    150 ml free water flush every 4 hours  Tube feeding regimen provides 2000 kcal (100% of needs), 105 grams of protein, and 914 ml of H2O. Total free water: 1814 ml  NUTRITION DIAGNOSIS:   Increased nutrient needs related to wound healing as evidenced by estimated needs.  GOAL:   Patient will meet greater than or equal to 90% of their needs  MONITOR:   Labs, Weight trends, TF tolerance, Skin, I & O's  REASON FOR ASSESSMENT:   Consult Enteral/tube feeding initiation and management  ASSESSMENT:   Jill Carlson is a  69 y.o. yo female w/ PMH significant for CVA (R MCA ichemic stroke with hemorrhage conversion), respiratory failure with tracheostomy in place on 2L O2, diabetes mellitus presenting from [facility] hospital for evaluation of ongoing rectal bleed. Patient is non-verbal. History obtained via chart review. Patient noted to have history of GI bleed from PEG tube on Protonix. She has baseline Hb 7-8; however, over the past few days has had Hb <7, requiring recurrent blood transfusion and IV Protonix. Given persistent GI bleed, patient transferred to [facility] for GI consultation.  Pt admitted with acute blood loss anemia secondary to GIB.   Reviewed I/O's: -3.3 L x 24 hours and -3.3 L since admission  UOP: 4.5 L x 24 hours  Pt on trach collar. She was fairly somnolent during assessment, however, intermittently opened her eyes during nutrition-focused physical exam.   [facility]. No PTA medication list available in Epic or shadow chart. Unsure of TF regimen PTA.  Noted clear liquid tray at bedside, however, not attempted. Plan to resume TF today per MD.   Labs reviewed: Na: 149, CBGS: 76-111 (inpatient orders for glycemic control are 0-15 units insulin aspart TID with meals).    NUTRITION - FOCUSED PHYSICAL EXAM:    Most Recent Value  Orbital Region  No depletion  Upper Arm Region  No depletion  Thoracic and Lumbar Region  No depletion  Buccal Region  No depletion  Temple Region  No depletion  Clavicle Bone Region  No depletion  Clavicle and Acromion Bone Region  No depletion  Scapular Bone Region  No depletion  Dorsal Hand  No depletion  Patellar Region  No depletion  Anterior Thigh Region  No depletion  Posterior Calf Region  No depletion  Edema (RD Assessment)  Mild  Hair  Reviewed  Eyes  Reviewed  Mouth  Reviewed  Skin  Reviewed  Nails  Reviewed       Diet Order:   Diet Order            Diet clear liquid Room service appropriate? Yes; Fluid consistency: Thin  Diet effective now              EDUCATION NEEDS:   Not appropriate for education at this time  Skin:  Skin Assessment: Skin Integrity Issues: Skin Integrity Issues:: Other (Comment), Stage II Stage II: buttocks Other: non-pressure wound rt thigh  Last BM:  12/17/19  Height:   Ht Readings from Last 1 Encounters:  12/16/19 5\' 5"  (1.651 m)    Weight:   Wt Readings from Last 1 Encounters:  12/18/19 96 kg    Ideal Body Weight:  56.8 kg  BMI:  Body mass index is 35.22 kg/m.  Estimated  Nutritional Needs:   Kcal:  1800-2000  Protein:  100-115 grams  Fluid:  > 1.8 L    Loistine Chance, RD, LDN, [place] Registered Dietitian II Certified Diabetes Care and Education Specialist Please refer to [facility] for RD and/or RD on-call/weekend/after hours pager

## 2019-12-18 NOTE — Progress Notes (Signed)
Paged to the bedside for facial rash. Daughter at the bedside stated she noticed it develop this evening. On examination, pt is flushed with erythema over bilateral cheeks and forehead (pictured below). No new medications changes noted, pt has been on Unasyn for 2 days now. No allergies/contraindications in the chart. Room is quite warm upon entering, suspect pt is flushed. Will call nursing for repeat temperature and a decrease in room temp.

## 2019-12-18 NOTE — Progress Notes (Addendum)
Hypoglycemic Event  CBG: 67  Treatment: Dextrose 50%, 50 mL  Symptoms: None  Follow-up CBG: Time:1150 CBG Result: 147  Possible Reasons for Event: Feeding tube has stopped due to GI bleed  Comments/MD notified: Dr Venetia Constable

## 2019-12-18 NOTE — Progress Notes (Signed)
Paged for patient possibly having new onset vaginal bleeding. On exam she does have blood around the vaginal area and possibly within the vagina but additionally has had another bloody bowel movement, and it is not completely clear if this is the source. Vitals currently stable. Will repeat CBC now and reevaluated in several hours after she is cleaned up to see if there is vaginal bleeding.   Selyna Klahn A, DO 12/18/2019, 5:01 PM Pager: [phone]

## 2019-12-18 NOTE — Progress Notes (Signed)
Yellow MEW. Respirations 24. Pt. Tachypnic. Not an acute change.

## 2019-12-18 NOTE — TOC Progression Note (Signed)
Transition of Care [facility]) - Progression Note    Patient Details  Name: Jill Carlson MRN: [id number] Date of Birth: 1951-08-06  Transition of Care [facility]) CM/SW Contact  Leone Haven, RN Phone Number: 12/18/2019, 9:19 AM  Clinical Narrative:    NCM received call from Carinna with [facility]) , they can take patient back when ever she is ready.         Expected Discharge Plan and Services                                                 Social Determinants of Health (SDOH) Interventions    Readmission Risk Interventions No flowsheet data found.

## 2019-12-19 ENCOUNTER — Other Ambulatory Visit (HOSPITAL_COMMUNITY): Payer: Medicare HMO

## 2019-12-19 ENCOUNTER — Inpatient Hospital Stay
Admission: RE | Admit: 2019-12-19 | Discharge: 2020-01-11 | Disposition: A | Discharge status: Hospice | Payer: Medicare HMO | Source: Other Acute Inpatient Hospital | Attending: Internal Medicine | Admitting: Internal Medicine

## 2019-12-19 DIAGNOSIS — Z931 Gastrostomy status: Secondary | ICD-10-CM

## 2019-12-19 DIAGNOSIS — I2699 Other pulmonary embolism without acute cor pulmonale: Secondary | ICD-10-CM | POA: Diagnosis present

## 2019-12-19 DIAGNOSIS — I69318 Other symptoms and signs involving cognitive functions following cerebral infarction: Secondary | ICD-10-CM

## 2019-12-19 DIAGNOSIS — J9621 Acute and chronic respiratory failure with hypoxia: Secondary | ICD-10-CM | POA: Diagnosis present

## 2019-12-19 DIAGNOSIS — U071 COVID-19: Secondary | ICD-10-CM | POA: Diagnosis present

## 2019-12-19 DIAGNOSIS — I63511 Cerebral infarction due to unspecified occlusion or stenosis of right middle cerebral artery: Secondary | ICD-10-CM | POA: Diagnosis present

## 2019-12-19 DIAGNOSIS — Z885 Allergy status to narcotic agent status: Secondary | ICD-10-CM

## 2019-12-19 DIAGNOSIS — R509 Fever, unspecified: Secondary | ICD-10-CM

## 2019-12-19 DIAGNOSIS — K922 Gastrointestinal hemorrhage, unspecified: Secondary | ICD-10-CM | POA: Diagnosis present

## 2019-12-19 DIAGNOSIS — J9690 Respiratory failure, unspecified, unspecified whether with hypoxia or hypercapnia: Secondary | ICD-10-CM

## 2019-12-19 DIAGNOSIS — J189 Pneumonia, unspecified organism: Secondary | ICD-10-CM

## 2019-12-19 HISTORY — DX: COVID-19: U07.1

## 2019-12-19 HISTORY — DX: Other pulmonary embolism without acute cor pulmonale: I26.99

## 2019-12-19 HISTORY — DX: Acute and chronic respiratory failure with hypoxia: J96.21

## 2019-12-19 HISTORY — DX: Gastrointestinal hemorrhage, unspecified: K92.2

## 2019-12-19 HISTORY — DX: Cerebral infarction due to unspecified occlusion or stenosis of right middle cerebral artery: I63.511

## 2019-12-19 LAB — CBC
HCT: 30.1 % — ABNORMAL LOW (ref 36.0–46.0)
Hemoglobin: 9.6 g/dL — ABNORMAL LOW (ref 12.0–15.0)
MCH: 30.3 pg (ref 26.0–34.0)
MCHC: 31.9 g/dL (ref 30.0–36.0)
MCV: 95 fL (ref 80.0–100.0)
Platelets: 184 10*3/uL (ref 150–400)
RBC: 3.17 MIL/uL — ABNORMAL LOW (ref 3.87–5.11)
RDW: 17.1 % — ABNORMAL HIGH (ref 11.5–15.5)
WBC: 14 10*3/uL — ABNORMAL HIGH (ref 4.0–10.5)
nRBC: 0.4 % — ABNORMAL HIGH (ref 0.0–0.2)

## 2019-12-19 LAB — BASIC METABOLIC PANEL
Anion gap: 9 (ref 5–15)
BUN: 19 mg/dL (ref 8–23)
CO2: 22 mmol/L (ref 22–32)
Calcium: 7.5 mg/dL — ABNORMAL LOW (ref 8.9–10.3)
Chloride: 118 mmol/L — ABNORMAL HIGH (ref 98–111)
Creatinine, Ser: 1.05 mg/dL — ABNORMAL HIGH (ref 0.44–1.00)
GFR calc Af Amer: >60 mL/min (ref >60)
GFR calc non Af Amer: 55 mL/min — ABNORMAL LOW (ref >60)
Glucose, Bld: 162 mg/dL — ABNORMAL HIGH (ref 70–99)
Potassium: 3.5 mmol/L (ref 3.5–5.1)
Sodium: 149 mmol/L — ABNORMAL HIGH (ref 135–145)

## 2019-12-19 LAB — GLUCOSE, CAPILLARY
Glucose-Capillary: 150 mg/dL — ABNORMAL HIGH (ref 70–99)
Glucose-Capillary: 148 mg/dL — ABNORMAL HIGH (ref 70–99)

## 2019-12-19 MED ORDER — OSMOLITE 1.5 CAL PO LIQD: 1000.0000 mL | ORAL | 0 refills | Status: AC

## 2019-12-19 MED ORDER — COLLAGENASE 250 UNIT/GM EX OINT: TOPICAL_OINTMENT | Freq: Every day | CUTANEOUS | 0 refills | Status: AC

## 2019-12-19 MED ORDER — PRO-STAT SUGAR FREE PO LIQD: 30.0000 mL | Freq: Two times a day (BID) | ORAL | 0 refills | Status: AC

## 2019-12-19 MED ORDER — SCOPOLAMINE 1 MG/3DAYS TD PT72
1.0000 | MEDICATED_PATCH | TRANSDERMAL | Status: DC
  Administered 2019-12-19: 1.5 mg via TRANSDERMAL
  Filled 2019-12-19: qty 1

## 2019-12-19 MED ORDER — SODIUM CHLORIDE 0.9 % IV SOLN: 3.0000 g | Freq: Four times a day (QID) | INTRAVENOUS | Status: AC

## 2019-12-19 MED ORDER — FUROSEMIDE 20 MG PO TABS
20.0000 mg | ORAL_TABLET | Freq: Every day | ORAL | Status: DC
  Administered 2019-12-19: 11:00:00 20 mg
  Filled 2019-12-19: qty 1

## 2019-12-19 MED ORDER — FUROSEMIDE 20 MG PO TABS: 20.0000 mg | ORAL_TABLET | Freq: Every day | ORAL | 0 refills | Status: AC

## 2019-12-19 MED ORDER — SODIUM CHLORIDE 0.9% FLUSH: 10.0000 mL | INTRAVENOUS | Status: DC | PRN

## 2019-12-19 MED ORDER — SODIUM CHLORIDE 0.9% FLUSH
10.0000 mL | Freq: Two times a day (BID) | INTRAVENOUS | Status: DC
  Administered 2019-12-19: 10 mL

## 2019-12-19 MED ORDER — SCOPOLAMINE 1 MG/3DAYS TD PT72: 1.0000 | MEDICATED_PATCH | TRANSDERMAL | 12 refills | Status: AC

## 2019-12-19 NOTE — Discharge Summary (Signed)
Name: Jill Carlson MRN: [id number] DOB: 10/06/1951 69 y.o. PCP: Patient, No Pcp Per  Date of Admission: 12/16/2019  1:33 PM Date of Discharge: 12/19/19 Attending Physician: Gust Rung, DO  Discharge Diagnosis: 1. GI bleed  Discharge Medications: Allergies as of 12/19/2019      Reactions   Codeine    Vicodin [hydrocodone-acetaminophen]       Medication List    STOP taking these medications   amantadine 100 MG capsule Commonly known as: SYMMETREL   ascorbic acid 500 MG tablet Commonly known as: VITAMIN C   Melatonin 3 MG Tabs   modafinil 100 MG tablet Commonly known as: PROVIGIL   Vitamin D (Cholecalciferol) 25 MCG (1000 UT) Tabs     TAKE these medications   amLODipine 10 MG tablet Commonly known as: NORVASC Give 10 mg by tube daily.   Ampicillin-Sulbactam 3 g in sodium chloride 0.9 % 100 mL Inject 3 g into the vein every 6 (six) hours.   collagenase ointment Commonly known as: SANTYL Apply topically daily. Start taking on: December 20, 2019   feeding supplement (OSMOLITE 1.5 CAL) Liqd Place 1,000 mLs into feeding tube continuous.   feeding supplement (PRO-STAT SUGAR FREE 64) Liqd Place 30 mLs into feeding tube 2 (two) times daily.   furosemide 20 MG tablet Commonly known as: LASIX Place 1 tablet (20 mg total) into feeding tube daily. Start taking on: December 20, 2019   insulin lispro 100 UNIT/ML injection Commonly known as: HUMALOG Inject 1 Units into the skin every 6 (six) hours.   labetalol 200 MG tablet Commonly known as: NORMODYNE Take 200 mg by mouth 2 (two) times daily.   levothyroxine 75 MCG tablet Commonly known as: SYNTHROID Place 75 mcg into feeding tube daily.   scopolamine 1 MG/3DAYS Commonly known as: TRANSDERM-SCOP Place 1 patch (1.5 mg total) onto the skin every 3 (three) days.       Disposition and follow-up:   Ms.Jill Carlson was discharged from [facility] in Fair condition.  At the hospital follow up  visit please address:  1. GI bleed. Hgb 9.6 on discharge. Please also reassess possible vaginal bleeding. Received one page about it and she had hematochezia so unclear if this was from her vagina or bowel. If she is having vaginal bleeding, will need to discuss what family would like to do regarding GOC. 2. Poor neurological function and prognosis and make sure palliative care has ongoing conversations with the family regarding GOC. 3. Acute on chronic respiratory failure. Infection vs volume overload.  2.  Labs / imaging needed at time of follow-up: CBC   Follow-up Appointments:  Palliative Care   Hospital Course: [name] is a 69 yo female with a recent CVA with hemorrhagic conversion in February resulting in minimal neurological function and requiring tracheostomy/PEG placement [facility] at which time she developed hematochezia and anemia (hgb mid 6's). She was admitted to the Internal Medicine Service for further evaluation and management.  GI was consulted and patient underwent a nuclear medicine bleeding scan which was negative. GI bleeding had slowed down significantly by that point and I suspect that is why the scan was negative. After 2U of PRBC transfusions, hgb had stabilized on day prior to discharge. GI had visited with the family regarding colonoscopy for further evaluation however it was believed that the risks outweighed the benefit at this time and they could revisit this in the  future if that changed.  Prior to admission, chest imaging had shown a pattern consistent with a multifocal pneumonia and she was started on flagyl and cefepime. On admission, she was switched to unasyn. She did initially require an increase from 2>>5L of supplemental oxygen via her trach however after switching to Unasyn, this remained stable. She does have a leukocytosis however I suspect that there is at least some component of volume  overload involved given her diffuse anasarca and should improve after restarting her lasix. Continue Unasyn for another 2 days after discharge to complete 5d course.  Her hospitalization was complicated by her severely diminished neurological function following her stroke which has shown little to no improvement. She is unresponsive; does not respond to pain, has spontaneous eye opening without tracking, and has an overall poor neurologic prognosis. Her daughter and I briefly visited the topic of goals of care and palliative care was consulted. We discussed how she is likely to have frequent admissions for infections, in particular pneumonia, with her bed bound state. I encouraged her daughter to hold further discussion with family regarding this. Unfortunately palliative care was unable to reach the family for a more in depth discussion but can hopefully follow outpatient.  At time of discharge, hematochezia had minimalized and hgb was stable. Respiratory status had remained stable on the new increase of 5L. She was afebrile and white count remains stable. She is discharged back to [facility].  Discharge Vitals:   BP (!) 147/91 (BP Location: Left Arm)   Pulse 100   Temp 98.3 F (36.8 C)   Resp (!) 28   Ht 5\' 5"  (1.651 m)   Wt 93 kg   SpO2 97%   BMI 34.12 kg/m   Pertinent Labs, Studies, and Procedures:  2/27 CXR: Little significant change with continued pulmonary vascular congestion with bilateral interstitial opacities/edema, bibasilar opacities and probable effusions.  NM GI bleeding scan: no evidence of active GI bleed.  CBC Latest Ref Rng & Units 12/19/2019 12/18/2019 12/18/2019  WBC 4.0 - 10.5 K/uL 14.0(H) 14.1(H) 13.6(H)  Hemoglobin 12.0 - 15.0 g/dL 9.6(L) 9.8(L) 9.4(L)  Hematocrit 36.0 - 46.0 % 30.1(L) 30.4(L) 28.7(L)  Platelets 150 - 400 K/uL 184 172 147(L)   BMP Latest Ref Rng & Units 12/19/2019 12/18/2019 12/17/2019  Glucose 70 - 99 mg/dL 162(H) 76 91  BUN 8 - 23 mg/dL  19 25(H) 31(H)  Creatinine 0.44 - 1.00 mg/dL 1.05(H) 1.10(H) 1.20(H)  Sodium 135 - 145 mmol/L 149(H) 149(H) 148(H)  Potassium 3.5 - 5.1 mmol/L 3.5 3.8 4.2  Chloride 98 - 111 mmol/L 118(H) 117(H) 119(H)  CO2 22 - 32 mmol/L 22 23 21(L)  Calcium 8.9 - 10.3 mg/dL 7.5(L) 7.4(L) 7.1(L)    Signed: Mitzi Hansen, MD 12/19/2019, 12:42 PM   Pager: [phone]

## 2019-12-19 NOTE — Plan of Care (Signed)
  Problem: Elimination: Goal: Will not experience complications related to bowel motility Outcome: Progressing   

## 2019-12-19 NOTE — Progress Notes (Signed)
NAME:  Jill Carlson, MRN:  [id number], DOB:  01-10-1951, LOS: 3 ADMISSION DATE:  12/16/2019  Subjective/Interm history  No overnight events VSS  Objective   Blood pressure (!) 148/73, pulse (!) 102, temperature 98.6 F (37 C), temperature source Oral, resp. rate (!) 24, height 5\' 5"  (1.651 m), weight 93 kg, SpO2 92 %.     Intake/Output Summary (Last 24 hours) at 12/19/2019 0534 Last data filed at 12/19/2019 0431 Gross per 24 hour  Intake 0 ml  Output 1550 ml  Net -1550 ml   Filed Weights   12/17/19 0500 12/18/19 0630 12/19/19 0453  Weight: 95 kg 96 kg 93 kg    Examination: GENERAL: chronically ill appearing HEENT: tracheostomy in place with copious mucous secretions. No surrounding erythema/edema CARDIAC: heart RRR. Diffuse anasarca PULMONARY: diffuse bilateral rhonchi ABDOMEN: PEG tub in place NEURO: not awake or alert. Does not awaken with sternal rub. non-purposeful eye movement. Does not withdraw to pain. Does not follow commands.  SKIN: purple discoloration in right lower quadrant of abdomen and skin fold crease.   Consults:  GI Palliative care Significant Diagnostic Tests:  2/27 CXR>bilateral intstitial opacities/edema, bibasilar opacites, probable effusions, pulm vascular congestion  2/28 Bleeding scan>no sign of active bleeding  Antimicrobials:  Unasyn 2/28>>  Labs    CBC Latest Ref Rng & Units 12/18/2019 12/18/2019 12/17/2019  WBC 4.0 - 10.5 K/uL 14.1(H) 13.6(H) 14.1(H)  Hemoglobin 12.0 - 15.0 g/dL 12/19/2019) 3.4(H) 10.1(L)  Hematocrit 36.0 - 46.0 % 30.4(L) 28.7(L) 30.5(L)  Platelets 150 - 400 K/uL 172 147(L) 123(L)   BMP Latest Ref Rng & Units 12/18/2019 12/17/2019 12/17/2019  Glucose 70 - 99 mg/dL 76 91 94  BUN 8 - 23 mg/dL 12/19/2019) 22(L) 79(G)  Creatinine 0.44 - 1.00 mg/dL 92(J) 1.94(R) 7.40(C  Sodium 135 - 145 mmol/L 149(H) 148(H) 149(H)  Potassium 3.5 - 5.1 mmol/L 3.8 4.2 2.3(LL)  Chloride 98 - 111 mmol/L 117(H) 119(H) 125(H)  CO2 22 - 32 mmol/L 23 21(L)  18(L)  Calcium 8.9 - 10.3 mg/dL 7.4(L) 7.1(L) 5.3(LL)    Summary  69 yo female with 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  Assessment & Plan:  Active Problems:   GIB (gastrointestinal bleeding)   Pressure injury of skin   Acute respiratory failure (HCC)   Acute blood loss anemia   Pneumonia of both lungs due to infectious organism   Severe comorbid illness   Goals of care, counseling/discussion   Palliative care by specialist  Acute GI Bleeding Acute Blood loss Anemia Hgb stable--9.4.   GI held conversation with family yesterday regarding diagnostic colonoscopy at which time it was decided they would hold off on it for now. Plan Continue protonix  Acute on Chronic respiratory failure s/p tracheostomy placement. 2L supplemental O2 requirement at baseline. Multifocal opacities. Infectious vs post covid vs pulm edema -remains stable on 5L.  Anasarca Plan Unasyn day #3 Lasix 20mg  restarted Wean O2 as able  Large bilateral MCA/PCA ischemic strokes with hemmorhagic conversions.  Unfortunate case. Overall poor neuro prognosis. Discussed GOC with daughter, 2022, yesterday and encouraged her to further discuss this with family Palliative consulted hwoever they were unable to reach family. [facility]  T2DM. One episode of hypoglycemia yesterdy prior to starting tube feeds. Suspect that this won't be an issue moving forward  sincec tube feeds have been restarted. Glucose stable. Continue SSI  Decubitus ulcer on coccyx. Wound care on board. Continue santyl and frequent turning.   Best practice:  CODE STATUS: Full Diet: Tube feeds DVT for prophylaxis: SCDs Dispo: [facility]  Mitzi Hansen, MD [place] PGY-1 PAGER #: [phone] 12/19/19  5:34 AM

## 2019-12-19 NOTE — TOC Transition Note (Signed)
Transition of Care [facility]) - CM/SW Discharge Note   Patient Details  Name: Jill Carlson MRN: [id number] Date of Birth: 1951-08-30  Transition of Care [facility]) CM/SW Contact:  Leone Haven, RN Phone Number: 12/19/2019, 12:38 PM   Clinical Narrative:    [facility] with [facility].  Patient is ready to dc [facility] to [facility].  Staff RN to call report to [phone]. And will Need dc summary.    Final next level of care: Long Term Acute Care (LTAC) Barriers to Discharge: No Barriers Identified   Patient Goals and CMS Choice        Discharge Placement                       Discharge Plan and Services                                     Social Determinants of Health (SDOH) Interventions     Readmission Risk Interventions No flowsheet data found.

## 2019-12-19 NOTE — Progress Notes (Signed)
Hemoglobin stable at 9.6.  BUN remains normal at 19.  Patient remains noncommunicative.  I was able to observe patient's stool output and do a rectal exam today with the assistance of the nurse and a tech.  The stool is a medium brown gelatinous liquid stool, with a small amount of blood tingeing.  On rectal examination, there seems to be some deformity of the perineal tissues.  Anal sphincter tone is very lax.  No rectal masses were felt although the mucosa of the rectum felt somewhat redundant (I do not think that this was a frond-like neoplasm).    With exam, there was some red-tinged fluid on the exam glove, the origin of which is not clear (?  Vaginal versus skin versus hemorrhoids versus rectal).  I spoke again with the patient's daughter, Jane Canary, on the phone today.  I offered the option of doing an unprepped flex sig to see what is going on in the rectum and try to get further clarification of the origin of the small amount of fresh blood that we are seeing.  After going over the pros and cons, it is her preference that we hold off at this time.  However, if there were a significant change in patient's status, for example heavy bleeding with drop in hemoglobin as occurred earlier, we would have to revisit that decision.  We will sign off at this time, but would be happy to see the patient again at your request.  Florencia Reasons, M.D. Pager [phone] If no answer or after 5 PM call [phone]

## 2019-12-19 NOTE — Care Management Important Message (Signed)
Important Message  Patient Details  Name: Jill Carlson MRN: [id number] Date of Birth: 02-24-1951   Medicare Important Message Given:  Yes  Due Mental altered status pt unable to sign,left the IM at bedside.     Sloan Leiter Smith 12/19/2019, 2:59 PM

## 2019-12-19 NOTE — Progress Notes (Signed)
Patient has a trach with chronic RR greater than 22. HR  and BP are also not new for the patient. Will make rounding team aware to address any concern to treat BP and HR.

## 2019-12-20 ENCOUNTER — Encounter: Payer: Self-pay | Admitting: Internal Medicine

## 2019-12-20 DIAGNOSIS — I2699 Other pulmonary embolism without acute cor pulmonale: Secondary | ICD-10-CM | POA: Diagnosis present

## 2019-12-20 DIAGNOSIS — I63511 Cerebral infarction due to unspecified occlusion or stenosis of right middle cerebral artery: Secondary | ICD-10-CM

## 2019-12-20 DIAGNOSIS — J9621 Acute and chronic respiratory failure with hypoxia: Secondary | ICD-10-CM | POA: Diagnosis present

## 2019-12-20 DIAGNOSIS — K922 Gastrointestinal hemorrhage, unspecified: Secondary | ICD-10-CM

## 2019-12-20 DIAGNOSIS — U071 COVID-19: Secondary | ICD-10-CM

## 2019-12-20 LAB — CBC WITH DIFFERENTIAL/PLATELET
Abs Immature Granulocytes: 0.45 10*3/uL — ABNORMAL HIGH (ref 0.00–0.07)
Basophils Absolute: 0.1 10*3/uL (ref 0.0–0.1)
Basophils Relative: 1 %
Eosinophils Absolute: 0.2 10*3/uL (ref 0.0–0.5)
Eosinophils Relative: 1 %
HCT: 27.8 % — ABNORMAL LOW (ref 36.0–46.0)
Hemoglobin: 8.9 g/dL — ABNORMAL LOW (ref 12.0–15.0)
Immature Granulocytes: 3 %
Lymphocytes Relative: 18 %
Lymphs Abs: 2.6 10*3/uL (ref 0.7–4.0)
MCH: 30.2 pg (ref 26.0–34.0)
MCHC: 32 g/dL (ref 30.0–36.0)
MCV: 94.2 fL (ref 80.0–100.0)
Monocytes Absolute: 1 10*3/uL (ref 0.1–1.0)
Monocytes Relative: 7 %
Neutro Abs: 10 10*3/uL — ABNORMAL HIGH (ref 1.7–7.7)
Neutrophils Relative %: 70 %
Platelets: 185 10*3/uL (ref 150–400)
RBC: 2.95 MIL/uL — ABNORMAL LOW (ref 3.87–5.11)
RDW: 16.9 % — ABNORMAL HIGH (ref 11.5–15.5)
WBC: 14.4 10*3/uL — ABNORMAL HIGH (ref 4.0–10.5)
nRBC: 0 % (ref 0.0–0.2)

## 2019-12-20 LAB — COMPREHENSIVE METABOLIC PANEL
ALT: 42 U/L (ref 0–44)
AST: 37 U/L (ref 15–41)
Albumin: 1.4 g/dL — ABNORMAL LOW (ref 3.5–5.0)
Alkaline Phosphatase: 183 U/L — ABNORMAL HIGH (ref 38–126)
Anion gap: 8 (ref 5–15)
BUN: 16 mg/dL (ref 8–23)
CO2: 21 mmol/L — ABNORMAL LOW (ref 22–32)
Calcium: 7.4 mg/dL — ABNORMAL LOW (ref 8.9–10.3)
Chloride: 116 mmol/L — ABNORMAL HIGH (ref 98–111)
Creatinine, Ser: 0.94 mg/dL (ref 0.44–1.00)
GFR calc Af Amer: >60 mL/min (ref >60)
GFR calc non Af Amer: >60 mL/min (ref >60)
Glucose, Bld: 117 mg/dL — ABNORMAL HIGH (ref 70–99)
Potassium: 3.2 mmol/L — ABNORMAL LOW (ref 3.5–5.1)
Sodium: 145 mmol/L (ref 135–145)
Total Bilirubin: 0.9 mg/dL (ref 0.3–1.2)
Total Protein: 4.6 g/dL — ABNORMAL LOW (ref 6.5–8.1)

## 2019-12-20 LAB — MAGNESIUM
Magnesium: 1.5 mg/dL — ABNORMAL LOW (ref 1.7–2.4)
Magnesium: 1.8 mg/dL (ref 1.7–2.4)

## 2019-12-20 LAB — PHOSPHORUS: Phosphorus: 2.3 mg/dL — ABNORMAL LOW (ref 2.5–4.6)

## 2019-12-20 LAB — PROTIME-INR
INR: 1.1 (ref 0.8–1.2)
Prothrombin Time: 14.6 seconds (ref 11.4–15.2)

## 2019-12-20 LAB — APTT: aPTT: 28 seconds (ref 24–36)

## 2019-12-20 LAB — POTASSIUM: Potassium: 3.6 mmol/L (ref 3.5–5.1)

## 2019-12-20 NOTE — Consult Note (Signed)
Pulmonary Critical Care Medicine [facility] [place]  PULMONARY SERVICE  Date of Service: 12/20/2019  PULMONARY CRITICAL CARE CONSULT   Jill Carlson  IOX:[id number]  DOB: 27-Apr-1951   DOA: 12/19/2019  Referring Physician: Carron Curie, MD  HPI: Jill Carlson is a 69 y.o. female seen for follow up of Acute on Chronic Respiratory Failure.  Patient has multiple medical problems including a history of a stroke which converted to hemorrhagic variety.  Had actually minimal neurological function [facility] however patient developed hematochezia and chronic anemia with hemoglobin down into the sixes.  Patient was transferred to the acute care facility.  Underwent a GI evaluation and nuclear medicine scan which was negative.  The GI bleeding apparently slowed down and patient did receive 2 units of packed red cells.  Further evaluation was with just monitoring the patient's hemoglobin.  The patient's family apparently did not want colonoscopy done because they felt like the risks were greater than the benefits.  Subsequently patient is transferred back for further management.Currently she is on a 5 L NAG device also a history of COVID-19.  Review of Systems:  ROS performed and is unremarkable other than noted above.  Past Medical History:  Diagnosis Date  . COVID-19   . CVA (cerebral vascular accident) (HCC)   . Diabetes mellitus without complication (HCC)   . Pulmonary embolism (HCC)   Diabetes mellitus Morbid obesity Hypertension Hypothyroidism  Past surgical history: Tracheostomy Cesarean section Hysteroscopy Thyroidectomy Tubal ligation Tonsillectomy  Social history: Unknown  Allergies  Allergen Reactions  . Vibramycin Itching and Rash  . Vicodin [Hydrocodone-Acetaminophen] Other  "I don't feel normal when I take it."  . Codeine Itching    Family History: Non-Contributory to the present illness  Allergies  Allergen Reactions  .  Codeine   . Vicodin [Hydrocodone-Acetaminophen]     Medications: Reviewed on Rounds  Physical Exam:  Vitals: Temperature 97.0 pulse 102 respiratory 32 blood pressure is 144/62 saturations 99%  Ventilator Settings off the ventilator on 5 L NAG  . General: Comfortable at this time . Eyes: Grossly normal lids, irises & conjunctiva . ENT: grossly tongue is normal . Neck: no obvious mass . Cardiovascular: S1-S2 normal no gallop or rub . Respiratory: Scattered rhonchi expansion is equal . Abdomen: Soft and nontender . Skin: no rash seen on limited exam . Musculoskeletal: not rigid . Psychiatric:unable to assess . Neurologic: no seizure no involuntary movements         Labs on Admission:  Basic Metabolic Panel: Recent Labs  Lab 12/17/19 0344 12/17/19 1449 12/18/19 0533 12/19/19 0536 12/20/19 0049  NA 149* 148* 149* 149* 145  K 2.3* 4.2 3.8 3.5 3.2*  CL 125* 119* 117* 118* 116*  CO2 18* 21* 23 22 21*  GLUCOSE 94 91 76 162* 117*  BUN 25* 31* 25* 19 16  CREATININE 0.91 1.20* 1.10* 1.05* 0.94  CALCIUM 5.3* 7.1* 7.4* 7.5* 7.4*  MG 1.0* 2.1 1.8  --  1.5*  PHOS 3.5  --  3.5  --  2.3*    No results for input(s): PHART, PCO2ART, PO2ART, HCO3, O2SAT in the last 168 hours.  Liver Function Tests: Recent Labs  Lab 12/16/19 1418 12/17/19 0344 12/20/19 0049  AST 19 16 37  ALT 47* 36 42  ALKPHOS 115 84 183*  BILITOT 0.8 0.7 0.9  PROT 3.6* 3.2* 4.6*  ALBUMIN 1.2* 1.0* 1.4*   Recent Labs  Lab 12/16/19 1418  LIPASE 35  No results for input(s): AMMONIA in the last 168 hours.  CBC: Recent Labs  Lab 12/16/19 1418 12/17/19 0344 12/17/19 1449 12/18/19 0533 12/18/19 1738 12/19/19 0536 12/20/19 0049  WBC 9.8   < > 14.1* 13.6* 14.1* 14.0* 14.4*  NEUTROABS 7.2  --   --   --   --   --  10.0*  HGB 6.0*   < > 10.1* 9.4* 9.8* 9.6* 8.9*  HCT 19.2*   < > 30.5* 28.7* 30.4* 30.1* 27.8*  MCV 97.0   < > 91.3 93.2 93.3 95.0 94.2  PLT 102*   < > 123* 147* 172 184 185   < > =  values in this interval not displayed.    Cardiac Enzymes: No results for input(s): CKTOTAL, CKMB, CKMBINDEX, TROPONINI in the last 168 hours.  BNP (last 3 results) No results for input(s): BNP in the last 8760 hours.  ProBNP (last 3 results) No results for input(s): PROBNP in the last 8760 hours.   Radiological Exams on Admission: NM GI Blood Loss  Result Date: 12/17/2019 CLINICAL DATA:  Gastrointestinal bleeding. EXAM: NUCLEAR MEDICINE GASTROINTESTINAL BLEEDING SCAN TECHNIQUE: Sequential abdominal images were obtained following intravenous administration of Tc-26m labeled red blood cells. RADIOPHARMACEUTICALS:  25.7 mCi Tc-69m pertechnetate in-vitro labeled red cells. COMPARISON:  None. FINDINGS: No abnormal focus of activity is demonstrated in the bowel to suggest a lower GI bleed. Normal physiologic biodistribution of the radiotracer is demonstrated throughout the abdomen. IMPRESSION: No evidence of active gastrointestinal bleeding. Electronically Signed   By: Elige Ko   On: 12/17/2019 12:46   DG ABDOMEN PEG TUBE LOCATION  Result Date: 12/19/2019 CLINICAL DATA:  Evaluate gastrostomy placement EXAM: ABDOMEN - 1 VIEW COMPARISON:  None. FINDINGS: Contrast material was injected through an indwelling gastrostomy catheter. Contrast flows directly into the stomach. This is similar to that seen on prior examination. Calcification over the pelvis is noted consistent with uterine fibroid. IMPRESSION: Gastrostomy catheter within the stomach. Electronically Signed   By: Alcide Clever M.D.   On: 12/19/2019 22:32   DG Chest Port 1 View  Result Date: 12/19/2019 CLINICAL DATA:  Respiratory failure EXAM: PORTABLE CHEST 1 VIEW COMPARISON:  12/16/2019 FINDINGS: Tracheostomy tube and right-sided PICC line are again seen and stable. Cardiac shadow is stable. Small left pleural effusion is noted. The overall degree of aeration has improved when compare with the prior exam. Improved vascular congestion is  noted as well. Persistent left basilar atelectasis is seen. IMPRESSION: Overall improved aeration bilaterally with reduction of vascular congestion. Tubes and lines as described. Electronically Signed   By: Alcide Clever M.D.   On: 12/19/2019 22:31   DG Chest Port 1 View  Result Date: 12/16/2019 CLINICAL DATA:  Tracheostomy with multiple medical complications. EXAM: PORTABLE CHEST 1 VIEW COMPARISON:  12/15/2019 FINDINGS: Tracheostomy tube and RIGHT PICC line again noted. Cardiomegaly, pulmonary vascular congestion with bilateral interstitial opacities, bibasilar opacities and probable effusions noted. There is no evidence of pneumothorax or acute bony abnormality. IMPRESSION: Little significant change with continued pulmonary vascular congestion with bilateral interstitial opacities/edema, bibasilar opacities and probable effusions. Electronically Signed   By: Harmon Pier M.D.   On: 12/16/2019 15:05    Assessment/Plan Active Problems:   Acute on chronic respiratory failure with hypoxia (HCC)   COVID-19 virus infection   Acute GI bleeding   Acute ischemic right MCA stroke (HCC)   Acute pulmonary embolism without acute cor pulmonale (HCC)   1. Acute on chronic respiratory failure with hypoxia patient is  weaning off the ventilator on the NAG right now.  I think we can continue to advance her slowly.  Would like to try PMV and capping 2. COVID-19 virus infection in resolution phase continue to monitor. 3. Acute pulmonary embolism has been treated we will continue with supportive care 4. Acute GI bleed no sign of active bleeding at this time we will continue to follow her hemoglobin.  She was deemed as not a candidate for colonoscopy at the other facility.  We will continue with supportive care 5. Multifocal pneumonia likely secondary to the COVID-19 changes noted. 6. Acute right MCA stroke no change patient had a hemorrhagic conversion monitor pressures closely  I have personally seen and evaluated  the patient, evaluated laboratory and imaging results, formulated the assessment and plan and placed orders. The Patient requires high complexity decision making with multiple systems involvement.  Case was discussed on Rounds with the Respiratory Therapy Director and the Respiratory staff Time Spent 3minutes  Delmar Dondero A Chaniah Cisse, MD [facility] Pulmonary Critical Care Medicine Sleep Medicine

## 2019-12-21 LAB — POTASSIUM: Potassium: 3.5 mmol/L (ref 3.5–5.1)

## 2019-12-21 NOTE — Progress Notes (Signed)
Pulmonary Critical Care Medicine [place]   PULMONARY CRITICAL CARE SERVICE  PROGRESS NOTE  Date of Service: 12/21/2019  Jill Carlson  NID:[id number]  DOB: June 11, 1951   DOA: 12/19/2019  Referring Physician: Merton Border, MD  HPI: Jill Carlson is a 69 y.o. female seen for follow up of Acute on Chronic Respiratory Failure.  Patient is on NAG right now is on room air has been having some tracheal secretions  Medications: Reviewed on Rounds  Physical Exam:  Vitals: Temperature is 96.9 pulse 83 respiratory rate 30 blood pressure is 151/68 saturations 100%  Ventilator Settings off the ventilator on the NAG right now on room air  . General: Comfortable at this time . Eyes: Grossly normal lids, irises & conjunctiva . ENT: grossly tongue is normal . Neck: no obvious mass . Cardiovascular: S1 S2 normal no gallop . Respiratory: Scattered rhonchi expansion is equal . Abdomen: soft . Skin: no rash seen on limited exam . Musculoskeletal: not rigid . Psychiatric:unable to assess . Neurologic: no seizure no involuntary movements         Lab Data:   Basic Metabolic Panel: Recent Labs  Lab 12/17/19 0344 12/17/19 0344 12/17/19 1449 12/17/19 1449 12/18/19 0533 12/19/19 0536 12/20/19 0049 12/20/19 1642 12/21/19 0704  NA 149*  --  148*  --  149* 149* 145  --   --   K 2.3*   < > 4.2   < > 3.8 3.5 3.2* 3.6 3.5  CL 125*  --  119*  --  117* 118* 116*  --   --   CO2 18*  --  21*  --  23 22 21*  --   --   GLUCOSE 94  --  91  --  76 162* 117*  --   --   BUN 25*  --  31*  --  25* 19 16  --   --   CREATININE 0.91  --  1.20*  --  1.10* 1.05* 0.94  --   --   CALCIUM 5.3*  --  7.1*  --  7.4* 7.5* 7.4*  --   --   MG 1.0*  --  2.1  --  1.8  --  1.5* 1.8  --   PHOS 3.5  --   --   --  3.5  --  2.3*  --   --    < > = values in this interval not displayed.    ABG: No results for input(s): PHART, PCO2ART, PO2ART, HCO3, O2SAT in the last 168 hours.  Liver Function  Tests: Recent Labs  Lab 12/16/19 1418 12/17/19 0344 12/20/19 0049  AST 19 16 37  ALT 47* 36 42  ALKPHOS 115 84 183*  BILITOT 0.8 0.7 0.9  PROT 3.6* 3.2* 4.6*  ALBUMIN 1.2* 1.0* 1.4*   Recent Labs  Lab 12/16/19 1418  LIPASE 35   No results for input(s): AMMONIA in the last 168 hours.  CBC: Recent Labs  Lab 12/16/19 1418 12/17/19 0344 12/17/19 1449 12/18/19 0533 12/18/19 1738 12/19/19 0536 12/20/19 0049  WBC 9.8   < > 14.1* 13.6* 14.1* 14.0* 14.4*  NEUTROABS 7.2  --   --   --   --   --  10.0*  HGB 6.0*   < > 10.1* 9.4* 9.8* 9.6* 8.9*  HCT 19.2*   < > 30.5* 28.7* 30.4* 30.1* 27.8*  MCV 97.0   < > 91.3 93.2 93.3 95.0 94.2  PLT 102*   < >  123* 147* 172 184 185   < > = values in this interval not displayed.    Cardiac Enzymes: No results for input(s): CKTOTAL, CKMB, CKMBINDEX, TROPONINI in the last 168 hours.  BNP (last 3 results) No results for input(s): BNP in the last 8760 hours.  ProBNP (last 3 results) No results for input(s): PROBNP in the last 8760 hours.  Radiological Exams: DG ABDOMEN PEG TUBE LOCATION  Result Date: 12/19/2019 CLINICAL DATA:  Evaluate gastrostomy placement EXAM: ABDOMEN - 1 VIEW COMPARISON:  None. FINDINGS: Contrast material was injected through an indwelling gastrostomy catheter. Contrast flows directly into the stomach. This is similar to that seen on prior examination. Calcification over the pelvis is noted consistent with uterine fibroid. IMPRESSION: Gastrostomy catheter within the stomach. Electronically Signed   By: Alcide Clever M.D.   On: 12/19/2019 22:32   DG Chest Port 1 View  Result Date: 12/19/2019 CLINICAL DATA:  Respiratory failure EXAM: PORTABLE CHEST 1 VIEW COMPARISON:  12/16/2019 FINDINGS: Tracheostomy tube and right-sided PICC line are again seen and stable. Cardiac shadow is stable. Small left pleural effusion is noted. The overall degree of aeration has improved when compare with the prior exam. Improved vascular congestion  is noted as well. Persistent left basilar atelectasis is seen. IMPRESSION: Overall improved aeration bilaterally with reduction of vascular congestion. Tubes and lines as described. Electronically Signed   By: Alcide Clever M.D.   On: 12/19/2019 22:31    Assessment/Plan Active Problems:   Acute on chronic respiratory failure with hypoxia (HCC)   COVID-19 virus infection   Acute GI bleeding   Acute ischemic right MCA stroke (HCC)   Acute pulmonary embolism without acute cor pulmonale (HCC)   1. Acute on chronic respiratory failure hypoxia plan continue change continue secretion management supportive care. 2. COVID-19 virus infection treated we will continue to follow along. 3. Acute stroke no change continue present management 4. Acute pulmonary embolism at baseline we will continue to follow 5. Acute GI bleed no sign of active bleeding   I have personally seen and evaluated the patient, evaluated laboratory and imaging results, formulated the assessment and plan and placed orders. The Patient requires high complexity decision making with multiple systems involvement.  Rounds were done with the Respiratory Therapy Director and Staff therapists and discussed with nursing staff also.  Yevonne Pax, MD [facility] Pulmonary Critical Care Medicine Sleep Medicine

## 2019-12-22 LAB — BASIC METABOLIC PANEL
Anion gap: 10 (ref 5–15)
BUN: 29 mg/dL — ABNORMAL HIGH (ref 8–23)
CO2: 22 mmol/L (ref 22–32)
Calcium: 7.8 mg/dL — ABNORMAL LOW (ref 8.9–10.3)
Chloride: 119 mmol/L — ABNORMAL HIGH (ref 98–111)
Creatinine, Ser: 1.16 mg/dL — ABNORMAL HIGH (ref 0.44–1.00)
GFR calc Af Amer: 56 mL/min — ABNORMAL LOW (ref >60)
GFR calc non Af Amer: 49 mL/min — ABNORMAL LOW (ref >60)
Glucose, Bld: 140 mg/dL — ABNORMAL HIGH (ref 70–99)
Potassium: 4.4 mmol/L (ref 3.5–5.1)
Sodium: 151 mmol/L — ABNORMAL HIGH (ref 135–145)

## 2019-12-22 NOTE — Progress Notes (Addendum)
Pulmonary Critical Care Medicine [place]   PULMONARY CRITICAL CARE SERVICE  PROGRESS NOTE  Date of Service: 12/22/2019  LY WASS  TDV:[id number]  DOB: 1951-03-04   DOA: 12/19/2019  Referring Physician: Merton Border, MD  HPI: Jill Carlson is a 69 y.o. female seen for follow up of Acute on Chronic Respiratory Failure.  Patient remains on nag 20% oxygen with a moderate amount of secretions.  Satting well no distress.  Medications: Reviewed on Rounds  Physical Exam:  Vitals: Pulse 80 respiration 32 BP 113/45 O2 sat 1% temp 97.2  Ventilator Settings NAG 21%  . General: Comfortable at this time . Eyes: Grossly normal lids, irises & conjunctiva . ENT: grossly tongue is normal . Neck: no obvious mass . Cardiovascular: S1 S2 normal no gallop . Respiratory: No rales or rhonchi noted . Abdomen: soft . Skin: no rash seen on limited exam . Musculoskeletal: not rigid . Psychiatric:unable to assess . Neurologic: no seizure no involuntary movements         Lab Data:   Basic Metabolic Panel: Recent Labs  Lab 12/17/19 0344 12/17/19 0344 12/17/19 1449 12/17/19 1449 12/18/19 0533 12/19/19 0536 12/20/19 0049 12/20/19 1642 12/21/19 0704  NA 149*  --  148*  --  149* 149* 145  --   --   K 2.3*   < > 4.2   < > 3.8 3.5 3.2* 3.6 3.5  CL 125*  --  119*  --  117* 118* 116*  --   --   CO2 18*  --  21*  --  23 22 21*  --   --   GLUCOSE 94  --  91  --  76 162* 117*  --   --   BUN 25*  --  31*  --  25* 19 16  --   --   CREATININE 0.91  --  1.20*  --  1.10* 1.05* 0.94  --   --   CALCIUM 5.3*  --  7.1*  --  7.4* 7.5* 7.4*  --   --   MG 1.0*  --  2.1  --  1.8  --  1.5* 1.8  --   PHOS 3.5  --   --   --  3.5  --  2.3*  --   --    < > = values in this interval not displayed.    ABG: No results for input(s): PHART, PCO2ART, PO2ART, HCO3, O2SAT in the last 168 hours.  Liver Function Tests: Recent Labs  Lab 12/16/19 1418 12/17/19 0344 12/20/19 0049  AST 19  16 37  ALT 47* 36 42  ALKPHOS 115 84 183*  BILITOT 0.8 0.7 0.9  PROT 3.6* 3.2* 4.6*  ALBUMIN 1.2* 1.0* 1.4*   Recent Labs  Lab 12/16/19 1418  LIPASE 35   No results for input(s): AMMONIA in the last 168 hours.  CBC: Recent Labs  Lab 12/16/19 1418 12/17/19 0344 12/17/19 1449 12/18/19 0533 12/18/19 1738 12/19/19 0536 12/20/19 0049  WBC 9.8   < > 14.1* 13.6* 14.1* 14.0* 14.4*  NEUTROABS 7.2  --   --   --   --   --  10.0*  HGB 6.0*   < > 10.1* 9.4* 9.8* 9.6* 8.9*  HCT 19.2*   < > 30.5* 28.7* 30.4* 30.1* 27.8*  MCV 97.0   < > 91.3 93.2 93.3 95.0 94.2  PLT 102*   < > 123* 147* 172 184 185   < > =  values in this interval not displayed.    Cardiac Enzymes: No results for input(s): CKTOTAL, CKMB, CKMBINDEX, TROPONINI in the last 168 hours.  BNP (last 3 results) No results for input(s): BNP in the last 8760 hours.  ProBNP (last 3 results) No results for input(s): PROBNP in the last 8760 hours.  Radiological Exams: No results found.  Assessment/Plan Active Problems:   Acute on chronic respiratory failure with hypoxia (HCC)   COVID-19 virus infection   Acute GI bleeding   Acute ischemic right MCA stroke (HCC)   Acute pulmonary embolism without acute cor pulmonale (HCC)   1. Acute on chronic respiratory failure hypoxia continue use NAG continue supportive measures and pulmonary toilet. 2. COVID-19 virus infection treated we will continue to follow along. 3. Acute stroke no change continue present management 4. Acute pulmonary embolism at baseline we will continue to follow 5. Acute GI bleed no sign of active bleeding   I have personally seen and evaluated the patient, evaluated laboratory and imaging results, formulated the assessment and plan and placed orders. The Patient requires high complexity decision making with multiple systems involvement.  Rounds were done with the Respiratory Therapy Director and Staff therapists and discussed with nursing staff  also.  Yevonne Pax, MD [facility] Pulmonary Critical Care Medicine Sleep Medicine

## 2019-12-23 LAB — BASIC METABOLIC PANEL
Anion gap: 9 (ref 5–15)
BUN: 31 mg/dL — ABNORMAL HIGH (ref 8–23)
CO2: 22 mmol/L (ref 22–32)
Calcium: 7.5 mg/dL — ABNORMAL LOW (ref 8.9–10.3)
Chloride: 118 mmol/L — ABNORMAL HIGH (ref 98–111)
Creatinine, Ser: 1.07 mg/dL — ABNORMAL HIGH (ref 0.44–1.00)
GFR calc Af Amer: >60 mL/min (ref >60)
GFR calc non Af Amer: 54 mL/min — ABNORMAL LOW (ref >60)
Glucose, Bld: 156 mg/dL — ABNORMAL HIGH (ref 70–99)
Potassium: 3.4 mmol/L — ABNORMAL LOW (ref 3.5–5.1)
Sodium: 149 mmol/L — ABNORMAL HIGH (ref 135–145)

## 2019-12-23 LAB — BLOOD GAS, ARTERIAL
Acid-Base Excess: 0.6 mmol/L (ref 0.0–2.0)
Bicarbonate: 25 mmol/L (ref 20.0–28.0)
Drawn by: 243969
FIO2: 21
O2 Saturation: 96.9 %
Patient temperature: 36.1
pCO2 arterial: 40.7 mmHg (ref 32.0–48.0)
pH, Arterial: 7.401 (ref 7.350–7.450)
pO2, Arterial: 78.1 mmHg — ABNORMAL LOW (ref 83.0–108.0)

## 2019-12-23 NOTE — Progress Notes (Addendum)
Pulmonary Critical Care Medicine [place]   PULMONARY CRITICAL CARE SERVICE  PROGRESS NOTE  Date of Service: 12/23/2019  Jill Carlson  NUU:[id number]  DOB: 1951-04-12   DOA: 12/19/2019  Referring Physician: Merton Border, MD  HPI: Jill Carlson is a 69 y.o. female seen for follow up of Acute on Chronic Respiratory Failure.  Patient continues on NAG room air.  Satting well no fever or distress.  Medications: Reviewed on Rounds  Physical Exam:  Vitals: Pulse 71 respirations 30 BP 144/50 O2 sat 100% temp 95.8   Ventilator Settings room air NAG  . General: Comfortable at this time . Eyes: Grossly normal lids, irises & conjunctiva . ENT: grossly tongue is normal . Neck: no obvious mass . Cardiovascular: S1 S2 normal no gallop . Respiratory: No rales or rhonchi noted . Abdomen: soft . Skin: no rash seen on limited exam . Musculoskeletal: not rigid . Psychiatric:unable to assess . Neurologic: no seizure no involuntary movements         Lab Data:   Basic Metabolic Panel: Recent Labs  Lab 12/17/19 0344 12/17/19 0344 12/17/19 1449 12/17/19 1449 12/18/19 0533 12/18/19 0533 12/19/19 0536 12/19/19 0536 12/20/19 0049 12/20/19 1642 12/21/19 0704 12/22/19 1339 12/23/19 0601  NA 149*   < > 148*   < > 149*  --  149*  --  145  --   --  151* 149*  K 2.3*   < > 4.2   < > 3.8   < > 3.5   < > 3.2* 3.6 3.5 4.4 3.4*  CL 125*   < > 119*   < > 117*  --  118*  --  116*  --   --  119* 118*  CO2 18*   < > 21*   < > 23  --  22  --  21*  --   --  22 22  GLUCOSE 94   < > 91   < > 76  --  162*  --  117*  --   --  140* 156*  BUN 25*   < > 31*   < > 25*  --  19  --  16  --   --  29* 31*  CREATININE 0.91   < > 1.20*   < > 1.10*  --  1.05*  --  0.94  --   --  1.16* 1.07*  CALCIUM 5.3*   < > 7.1*   < > 7.4*  --  7.5*  --  7.4*  --   --  7.8* 7.5*  MG 1.0*  --  2.1  --  1.8  --   --   --  1.5* 1.8  --   --   --   PHOS 3.5  --   --   --  3.5  --   --   --  2.3*  --   --    --   --    < > = values in this interval not displayed.    ABG: Recent Labs  Lab 12/23/19 0413  PHART 7.401  PCO2ART 40.7  PO2ART 78.1*  HCO3 25.0  O2SAT 96.9    Liver Function Tests: Recent Labs  Lab 12/17/19 0344 12/20/19 0049  AST 16 37  ALT 36 42  ALKPHOS 84 183*  BILITOT 0.7 0.9  PROT 3.2* 4.6*  ALBUMIN 1.0* 1.4*   No results for input(s): LIPASE, AMYLASE in the last 168 hours. No results  for input(s): AMMONIA in the last 168 hours.  CBC: Recent Labs  Lab 12/17/19 1449 12/18/19 0533 12/18/19 1738 12/19/19 0536 12/20/19 0049  WBC 14.1* 13.6* 14.1* 14.0* 14.4*  NEUTROABS  --   --   --   --  10.0*  HGB 10.1* 9.4* 9.8* 9.6* 8.9*  HCT 30.5* 28.7* 30.4* 30.1* 27.8*  MCV 91.3 93.2 93.3 95.0 94.2  PLT 123* 147* 172 184 185    Cardiac Enzymes: No results for input(s): CKTOTAL, CKMB, CKMBINDEX, TROPONINI in the last 168 hours.  BNP (last 3 results) No results for input(s): BNP in the last 8760 hours.  ProBNP (last 3 results) No results for input(s): PROBNP in the last 8760 hours.  Radiological Exams: No results found.  Assessment/Plan Active Problems:   Acute on chronic respiratory failure with hypoxia (HCC)   COVID-19 virus infection   Acute GI bleeding   Acute ischemic right MCA stroke (HCC)   Acute pulmonary embolism without acute cor pulmonale (HCC)   1. Acute on chronic respiratory failure hypoxia continue use NAG continue supportive measures and pulmonary toilet. 2. COVID-19 virus infection treated we will continue to follow along. 3. Acute stroke no change continue present management 4. Acute pulmonary embolism at baseline we will continue to follow 5. Acute GI bleed no sign of active bleeding   I have personally seen and evaluated the patient, evaluated laboratory and imaging results, formulated the assessment and plan and placed orders. The Patient requires high complexity decision making with multiple systems involvement.  Rounds were  done with the Respiratory Therapy Director and Staff therapists and discussed with nursing staff also.  Yevonne Pax, MD [facility] Pulmonary Critical Care Medicine Sleep Medicine

## 2019-12-24 LAB — CBC
HCT: 19.4 % — ABNORMAL LOW (ref 36.0–46.0)
Hemoglobin: 6.1 g/dL — CL (ref 12.0–15.0)
MCH: 30.8 pg (ref 26.0–34.0)
MCHC: 31.4 g/dL (ref 30.0–36.0)
MCV: 98 fL (ref 80.0–100.0)
Platelets: 213 10*3/uL (ref 150–400)
RBC: 1.98 MIL/uL — ABNORMAL LOW (ref 3.87–5.11)
RDW: 17.3 % — ABNORMAL HIGH (ref 11.5–15.5)
WBC: 13.4 10*3/uL — ABNORMAL HIGH (ref 4.0–10.5)
nRBC: 0.7 % — ABNORMAL HIGH (ref 0.0–0.2)

## 2019-12-24 LAB — BASIC METABOLIC PANEL
Anion gap: 8 (ref 5–15)
BUN: 35 mg/dL — ABNORMAL HIGH (ref 8–23)
CO2: 21 mmol/L — ABNORMAL LOW (ref 22–32)
Calcium: 7.2 mg/dL — ABNORMAL LOW (ref 8.9–10.3)
Chloride: 114 mmol/L — ABNORMAL HIGH (ref 98–111)
Creatinine, Ser: 0.93 mg/dL (ref 0.44–1.00)
GFR calc Af Amer: >60 mL/min (ref >60)
GFR calc non Af Amer: >60 mL/min (ref >60)
Glucose, Bld: 100 mg/dL — ABNORMAL HIGH (ref 70–99)
Potassium: 4.6 mmol/L (ref 3.5–5.1)
Sodium: 143 mmol/L (ref 135–145)

## 2019-12-24 LAB — PREPARE RBC (CROSSMATCH)

## 2019-12-24 NOTE — Progress Notes (Addendum)
Pulmonary Critical Care Medicine [facility] [place]   PULMONARY CRITICAL CARE SERVICE  PROGRESS NOTE  Date of Service: 12/24/2019  Jill Carlson  JKD:[id number]  DOB: 1951/10/18   DOA: 12/19/2019  Referring Physician: Carron Curie, MD  HPI: Jill Carlson is a 69 y.o. female seen for follow up of Acute on Chronic Respiratory Failure.  Patient remains on NAG 21%, room air.  Satting well no fever or distress.  Medications: Reviewed on Rounds  Physical Exam:  Vitals: Pulse 87 respirations 32 BP 119/47 O2 sat 99% temp 98.4  Ventilator Settings NAG 21%  . General: Comfortable at this time . Eyes: Grossly normal lids, irises & conjunctiva . ENT: grossly tongue is normal . Neck: no obvious mass . Cardiovascular: S1 S2 normal no gallop . Respiratory: No rales or rhonchi noted . Abdomen: soft . Skin: no rash seen on limited exam . Musculoskeletal: not rigid . Psychiatric:unable to assess . Neurologic: no seizure no involuntary movements         Lab Data:   Basic Metabolic Panel: Recent Labs  Lab 12/18/19 0533 12/18/19 0533 12/19/19 0536 12/19/19 0536 12/20/19 0049 12/20/19 0049 12/20/19 1642 12/21/19 0704 12/22/19 1339 12/23/19 0601 12/24/19 0639  NA 149*   < > 149*  --  145  --   --   --  151* 149* 143  K 3.8   < > 3.5   < > 3.2*   < > 3.6 3.5 4.4 3.4* 4.6  CL 117*   < > 118*  --  116*  --   --   --  119* 118* 114*  CO2 23   < > 22  --  21*  --   --   --  22 22 21*  GLUCOSE 76   < > 162*  --  117*  --   --   --  140* 156* 100*  BUN 25*   < > 19  --  16  --   --   --  29* 31* 35*  CREATININE 1.10*   < > 1.05*  --  0.94  --   --   --  1.16* 1.07* 0.93  CALCIUM 7.4*   < > 7.5*  --  7.4*  --   --   --  7.8* 7.5* 7.2*  MG 1.8  --   --   --  1.5*  --  1.8  --   --   --   --   PHOS 3.5  --   --   --  2.3*  --   --   --   --   --   --    < > = values in this interval not displayed.    ABG: Recent Labs  Lab 12/23/19 0413  PHART 7.401  PCO2ART 40.7   PO2ART 78.1*  HCO3 25.0  O2SAT 96.9    Liver Function Tests: Recent Labs  Lab 12/20/19 0049  AST 37  ALT 42  ALKPHOS 183*  BILITOT 0.9  PROT 4.6*  ALBUMIN 1.4*   No results for input(s): LIPASE, AMYLASE in the last 168 hours. No results for input(s): AMMONIA in the last 168 hours.  CBC: Recent Labs  Lab 12/18/19 0533 12/18/19 1738 12/19/19 0536 12/20/19 0049 12/24/19 0639  WBC 13.6* 14.1* 14.0* 14.4* 13.4*  NEUTROABS  --   --   --  10.0*  --   HGB 9.4* 9.8* 9.6* 8.9* 6.1*  HCT 28.7* 30.4* 30.1* 27.8*  19.4*  MCV 93.2 93.3 95.0 94.2 98.0  PLT 147* 172 184 185 213    Cardiac Enzymes: No results for input(s): CKTOTAL, CKMB, CKMBINDEX, TROPONINI in the last 168 hours.  BNP (last 3 results) No results for input(s): BNP in the last 8760 hours.  ProBNP (last 3 results) No results for input(s): PROBNP in the last 8760 hours.  Radiological Exams: No results found.  Assessment/Plan Active Problems:   Acute on chronic respiratory failure with hypoxia (HCC)   COVID-19 virus infection   Acute GI bleeding   Acute ischemic right MCA stroke ([place])   Acute pulmonary embolism without acute cor pulmonale ([place])   1. Acute on chronic respiratory failure hypoxiacontinue use NAG continue supportive measures and pulmonary toilet. 2. COVID-19 virus infection treated we will continue to follow along. 3. Acute stroke no change continue present management 4. Acute pulmonary embolism at baseline we will continue to follow 5. Acute GI bleed no sign of active bleeding    I have personally seen and evaluated the patient, evaluated laboratory and imaging results, formulated the assessment and plan and placed orders. The Patient requires high complexity decision making with multiple systems involvement.  Rounds were done with the Respiratory Therapy Director and Staff therapists and discussed with nursing staff also.  Allyne Gee, MD [facility] Pulmonary Critical Care Medicine Sleep  Medicine

## 2019-12-25 LAB — HEMOGLOBIN AND HEMATOCRIT, BLOOD
HCT: 29.1 % — ABNORMAL LOW (ref 36.0–46.0)
Hemoglobin: 9.5 g/dL — ABNORMAL LOW (ref 12.0–15.0)

## 2019-12-25 NOTE — Progress Notes (Signed)
Pulmonary Critical Care Medicine [place]   PULMONARY CRITICAL CARE SERVICE  PROGRESS NOTE  Date of Service: 12/25/2019  SKYLEIGH WINDLE  WNU:[id number]  DOB: May 07, 1951   DOA: 12/19/2019  Referring Physician: Merton Border, MD  HPI: Jill Carlson is a 69 y.o. female seen for follow up of Acute on Chronic Respiratory Failure.  Patient right now is on the NAG has been on room air secretions are still significant however  Medications: Reviewed on Rounds  Physical Exam:  Vitals: Temperature is 98.4 pulse 92 respiratory rate 30 blood pressure is 132/58 saturations 98%  Ventilator Settings on the NAG on room air  . General: Comfortable at this time . Eyes: Grossly normal lids, irises & conjunctiva . ENT: grossly tongue is normal . Neck: no obvious mass . Cardiovascular: S1 S2 normal no gallop . Respiratory: No rhonchi coarse breath sounds . Abdomen: soft . Skin: no rash seen on limited exam . Musculoskeletal: not rigid . Psychiatric:unable to assess . Neurologic: no seizure no involuntary movements         Lab Data:   Basic Metabolic Panel: Recent Labs  Lab 12/19/19 0536 12/19/19 0536 12/20/19 0049 12/20/19 0049 12/20/19 1642 12/21/19 0704 12/22/19 1339 12/23/19 0601 12/24/19 0639  NA 149*  --  145  --   --   --  151* 149* 143  K 3.5   < > 3.2*   < > 3.6 3.5 4.4 3.4* 4.6  CL 118*  --  116*  --   --   --  119* 118* 114*  CO2 22  --  21*  --   --   --  22 22 21*  GLUCOSE 162*  --  117*  --   --   --  140* 156* 100*  BUN 19  --  16  --   --   --  29* 31* 35*  CREATININE 1.05*  --  0.94  --   --   --  1.16* 1.07* 0.93  CALCIUM 7.5*  --  7.4*  --   --   --  7.8* 7.5* 7.2*  MG  --   --  1.5*  --  1.8  --   --   --   --   PHOS  --   --  2.3*  --   --   --   --   --   --    < > = values in this interval not displayed.    ABG: Recent Labs  Lab 12/23/19 0413  PHART 7.401  PCO2ART 40.7  PO2ART 78.1*  HCO3 25.0  O2SAT 96.9    Liver Function  Tests: Recent Labs  Lab 12/20/19 0049  AST 37  ALT 42  ALKPHOS 183*  BILITOT 0.9  PROT 4.6*  ALBUMIN 1.4*   No results for input(s): LIPASE, AMYLASE in the last 168 hours. No results for input(s): AMMONIA in the last 168 hours.  CBC: Recent Labs  Lab 12/18/19 1738 12/19/19 0536 12/20/19 0049 12/24/19 0639 12/25/19 0525  WBC 14.1* 14.0* 14.4* 13.4*  --   NEUTROABS  --   --  10.0*  --   --   HGB 9.8* 9.6* 8.9* 6.1* 9.5*  HCT 30.4* 30.1* 27.8* 19.4* 29.1*  MCV 93.3 95.0 94.2 98.0  --   PLT 172 184 185 213  --     Cardiac Enzymes: No results for input(s): CKTOTAL, CKMB, CKMBINDEX, TROPONINI in the last 168 hours.  BNP (last  3 results) No results for input(s): BNP in the last 8760 hours.  ProBNP (last 3 results) No results for input(s): PROBNP in the last 8760 hours.  Radiological Exams: No results found.  Assessment/Plan Active Problems:   Acute on chronic respiratory failure with hypoxia (HCC)   COVID-19 virus infection   Acute GI bleeding   Acute ischemic right MCA stroke (HCC)   Acute pulmonary embolism without acute cor pulmonale (HCC)   1. Acute on chronic respiratory failure hypoxia we will proceed to changing the trach out to a cuffless trach today 2. COVID-19 virus infection in resolution phase 3. Acute GI bleed no active bleeding 4. Acute stroke no change supportive care 5. Pulmonary embolism treated   I have personally seen and evaluated the patient, evaluated laboratory and imaging results, formulated the assessment and plan and placed orders. The Patient requires high complexity decision making with multiple systems involvement.  Rounds were done with the Respiratory Therapy Director and Staff therapists and discussed with nursing staff also.  Yevonne Pax, MD [facility] Pulmonary Critical Care Medicine Sleep Medicine

## 2019-12-26 LAB — BASIC METABOLIC PANEL
Anion gap: 10 (ref 5–15)
BUN: 25 mg/dL — ABNORMAL HIGH (ref 8–23)
CO2: 22 mmol/L (ref 22–32)
Calcium: 8.1 mg/dL — ABNORMAL LOW (ref 8.9–10.3)
Chloride: 113 mmol/L — ABNORMAL HIGH (ref 98–111)
Creatinine, Ser: 0.69 mg/dL (ref 0.44–1.00)
GFR calc Af Amer: >60 mL/min (ref >60)
GFR calc non Af Amer: >60 mL/min (ref >60)
Glucose, Bld: 141 mg/dL — ABNORMAL HIGH (ref 70–99)
Potassium: 3.6 mmol/L (ref 3.5–5.1)
Sodium: 145 mmol/L (ref 135–145)

## 2019-12-26 NOTE — Progress Notes (Signed)
Pulmonary Critical Care Medicine [facility]   PULMONARY CRITICAL CARE SERVICE  PROGRESS NOTE  Date of Service: 12/26/2019  Jill Carlson  UXN:[id number]  DOB: [birth year]   DOA: 12/19/2019  Referring Physician: Merton Border, MD  HPI: Jill Carlson is a 69 y.o. female seen for follow up of Acute on Chronic Respiratory Failure.  Patient at this time is on the NAG has been on room air no issues are noted at this time secretions still remain somewhat of an issue  Medications: Reviewed on Rounds  Physical Exam:  Vitals: Temperature is 99.1 pulse 69 respiratory rate 27 blood pressure is 172/81 saturations 100%  Ventilator Settings off the ventilator on the NAG on room air  . General: Comfortable at this time . Eyes: Grossly normal lids, irises & conjunctiva . ENT: grossly tongue is normal . Neck: no obvious mass . Cardiovascular: S1 S2 normal no gallop . Respiratory: No rhonchi no rales are noted at this time . Abdomen: soft . Skin: no rash seen on limited exam . Musculoskeletal: not rigid . Psychiatric:unable to assess . Neurologic: no seizure no involuntary movements         Lab Data:   Basic Metabolic Panel: Recent Labs  Lab 12/20/19 0049 12/20/19 0049 12/20/19 1642 12/20/19 1642 12/21/19 0704 12/22/19 1339 12/23/19 0601 12/24/19 0639 12/26/19 1312  NA 145  --   --   --   --  151* 149* 143 145  K 3.2*   < > 3.6   < > 3.5 4.4 3.4* 4.6 3.6  CL 116*  --   --   --   --  119* 118* 114* 113*  CO2 21*  --   --   --   --  22 22 21* 22  GLUCOSE 117*  --   --   --   --  140* 156* 100* 141*  BUN 16  --   --   --   --  29* 31* 35* 25*  CREATININE 0.94  --   --   --   --  1.16* 1.07* 0.93 0.69  CALCIUM 7.4*  --   --   --   --  7.8* 7.5* 7.2* 8.1*  MG 1.5*  --  1.8  --   --   --   --   --   --   PHOS 2.3*  --   --   --   --   --   --   --   --    < > = values in this interval not displayed.    ABG: Recent Labs  Lab 12/23/19 0413  PHART 7.401   PCO2ART 40.7  PO2ART 78.1*  HCO3 25.0  O2SAT 96.9    Liver Function Tests: Recent Labs  Lab 12/20/19 0049  AST 37  ALT 42  ALKPHOS 183*  BILITOT 0.9  PROT 4.6*  ALBUMIN 1.4*   No results for input(s): LIPASE, AMYLASE in the last 168 hours. No results for input(s): AMMONIA in the last 168 hours.  CBC: Recent Labs  Lab 12/20/19 0049 12/24/19 0639 12/25/19 0525  WBC 14.4* 13.4*  --   NEUTROABS 10.0*  --   --   HGB 8.9* 6.1* 9.5*  HCT 27.8* 19.4* 29.1*  MCV 94.2 98.0  --   PLT 185 213  --     Cardiac Enzymes: No results for input(s): CKTOTAL, CKMB, CKMBINDEX, TROPONINI in the last 168 hours.  BNP (last 3 results) No  results for input(s): BNP in the last 8760 hours.  ProBNP (last 3 results) No results for input(s): PROBNP in the last 8760 hours.  Radiological Exams: No results found.  Assessment/Plan Active Problems:   Acute on chronic respiratory failure with hypoxia (HCC)   COVID-19 virus infection   Acute GI bleeding   Acute ischemic right MCA stroke (HCC)   Acute pulmonary embolism without acute cor pulmonale (HCC)   1. Acute on chronic respiratory failure with hypoxia we will continue on the NAG currently is on 2 L O2 2. COVID-19 virus infection we will continue with supportive care 3. Acute GI bleed no active bleeding is noted 4. Acute stroke MCA we will continue to follow along 5. Acute pulmonary embolism treated we will continue with present management   I have personally seen and evaluated the patient, evaluated laboratory and imaging results, formulated the assessment and plan and placed orders. The Patient requires high complexity decision making with multiple systems involvement.  Rounds were done with the Respiratory Therapy Director and Staff therapists and discussed with nursing staff also.  Yevonne Pax, MD [facility] Pulmonary Critical Care Medicine Sleep Medicine

## 2019-12-27 NOTE — Progress Notes (Addendum)
Pulmonary Critical Care Medicine [facility] [place]   PULMONARY CRITICAL CARE SERVICE  PROGRESS NOTE  Date of Service: 12/27/2019  Jill Carlson  YHC:[id number]  DOB: 1951-06-28   DOA: 12/19/2019  Referring Physician: Carron Curie, MD  HPI: Jill Carlson is a 69 y.o. female seen for follow up of Acute on Chronic Respiratory Failure.  Patient is at baseline on room air NAG as tolerated.  Distress or fever noted.  Medications: Reviewed on Rounds  Physical Exam:  Vitals: Pulse 74 respirations 33 BP 149/52 O2 sat 99% temp 97.4  Ventilator Settings NAG room air  . General: Comfortable at this time . Eyes: Grossly normal lids, irises & conjunctiva . ENT: grossly tongue is normal . Neck: no obvious mass . Cardiovascular: S1 S2 normal no gallop . Respiratory: No rales or rhonchi noted . Abdomen: soft . Skin: no rash seen on limited exam . Musculoskeletal: not rigid . Psychiatric:unable to assess . Neurologic: no seizure no involuntary movements         Lab Data:   Basic Metabolic Panel: Recent Labs  Lab 12/21/19 0704 12/22/19 1339 12/23/19 0601 12/24/19 0639 12/26/19 1312  NA  --  151* 149* 143 145  K 3.5 4.4 3.4* 4.6 3.6  CL  --  119* 118* 114* 113*  CO2  --  22 22 21* 22  GLUCOSE  --  140* 156* 100* 141*  BUN  --  29* 31* 35* 25*  CREATININE  --  1.16* 1.07* 0.93 0.69  CALCIUM  --  7.8* 7.5* 7.2* 8.1*    ABG: Recent Labs  Lab 12/23/19 0413  PHART 7.401  PCO2ART 40.7  PO2ART 78.1*  HCO3 25.0  O2SAT 96.9    Liver Function Tests: No results for input(s): AST, ALT, ALKPHOS, BILITOT, PROT, ALBUMIN in the last 168 hours. No results for input(s): LIPASE, AMYLASE in the last 168 hours. No results for input(s): AMMONIA in the last 168 hours.  CBC: Recent Labs  Lab 12/24/19 0639 12/25/19 0525  WBC 13.4*  --   HGB 6.1* 9.5*  HCT 19.4* 29.1*  MCV 98.0  --   PLT 213  --     Cardiac Enzymes: No results for input(s): CKTOTAL, CKMB,  CKMBINDEX, TROPONINI in the last 168 hours.  BNP (last 3 results) No results for input(s): BNP in the last 8760 hours.  ProBNP (last 3 results) No results for input(s): PROBNP in the last 8760 hours.  Radiological Exams: No results found.  Assessment/Plan Active Problems:   Acute on chronic respiratory failure with hypoxia (HCC)   COVID-19 virus infection   Acute GI bleeding   Acute ischemic right MCA stroke (HCC)   Acute pulmonary embolism without acute cor pulmonale (HCC)   1. Acute on chronic respiratory failure with hypoxia we will continue on the NAG currently is on room air 2. COVID-19 virus infection we will continue with supportive care 3. Acute GI bleed no active bleeding is noted 4. Acute stroke MCA we will continue to follow along 5. Acute pulmonary embolism treated we will continue with present management   I have personally seen and evaluated the patient, evaluated laboratory and imaging results, formulated the assessment and plan and placed orders. The Patient requires high complexity decision making with multiple systems involvement.  Rounds were done with the Respiratory Therapy Director and Staff therapists and discussed with nursing staff also.  Yevonne Pax, MD [facility] Pulmonary Critical Care Medicine Sleep Medicine

## 2019-12-28 LAB — TYPE AND SCREEN
ABO/RH(D): A POS
Antibody Screen: NEGATIVE
Unit division: 0
Unit division: 0
Unit division: 0

## 2019-12-28 LAB — BPAM RBC
Blood Product Expiration Date: 202103302359
Blood Product Expiration Date: 202104062359
Blood Product Expiration Date: 202104062359
ISSUE DATE / TIME: 202103070944
ISSUE DATE / TIME: 202103071544
Unit Type and Rh: 6200
Unit Type and Rh: 6200
Unit Type and Rh: 6200

## 2019-12-28 LAB — CBC
HCT: 27.9 % — ABNORMAL LOW (ref 36.0–46.0)
Hemoglobin: 9 g/dL — ABNORMAL LOW (ref 12.0–15.0)
MCH: 30 pg (ref 26.0–34.0)
MCHC: 32.3 g/dL (ref 30.0–36.0)
MCV: 93 fL (ref 80.0–100.0)
Platelets: 210 10*3/uL (ref 150–400)
RBC: 3 MIL/uL — ABNORMAL LOW (ref 3.87–5.11)
RDW: 18.3 % — ABNORMAL HIGH (ref 11.5–15.5)
WBC: 7.3 10*3/uL (ref 4.0–10.5)
nRBC: 0.4 % — ABNORMAL HIGH (ref 0.0–0.2)

## 2019-12-28 NOTE — Progress Notes (Addendum)
Pulmonary Critical Care Medicine [facility] [place]   PULMONARY CRITICAL CARE SERVICE  PROGRESS NOTE  Date of Service: 12/28/2019  Jill Carlson  AYT:[id number]  DOB: September 27, 1951   DOA: 12/19/2019  Referring Physician: Carron Curie, MD  HPI: Jill Carlson is a 69 y.o. female seen for follow up of Acute on Chronic Respiratory Failure.  Patient at this time is on the NAG doing well has been on room air.  Medications: Reviewed on Rounds  Physical Exam:  Vitals: Temperature is 96.6 pulse 73 respiratory rate is 27 blood pressure is 153/77 saturations 100%  Ventilator Settings off the ventilator on the NAG  . General: Comfortable at this time . Eyes: Grossly normal lids, irises & conjunctiva . ENT: grossly tongue is normal . Neck: no obvious mass . Cardiovascular: S1 S2 normal no gallop . Respiratory: No rhonchi no rales are noted at this time . Abdomen: soft . Skin: no rash seen on limited exam . Musculoskeletal: not rigid . Psychiatric:unable to assess . Neurologic: no seizure no involuntary movements         Lab Data:   Basic Metabolic Panel: Recent Labs  Lab 12/22/19 1339 12/23/19 0601 12/24/19 0639 12/26/19 1312  NA 151* 149* 143 145  K 4.4 3.4* 4.6 3.6  CL 119* 118* 114* 113*  CO2 22 22 21* 22  GLUCOSE 140* 156* 100* 141*  BUN 29* 31* 35* 25*  CREATININE 1.16* 1.07* 0.93 0.69  CALCIUM 7.8* 7.5* 7.2* 8.1*    ABG: Recent Labs  Lab 12/23/19 0413  PHART 7.401  PCO2ART 40.7  PO2ART 78.1*  HCO3 25.0  O2SAT 96.9    Liver Function Tests: No results for input(s): AST, ALT, ALKPHOS, BILITOT, PROT, ALBUMIN in the last 168 hours. No results for input(s): LIPASE, AMYLASE in the last 168 hours. No results for input(s): AMMONIA in the last 168 hours.  CBC: Recent Labs  Lab 12/24/19 0639 12/25/19 0525 12/28/19 0647  WBC 13.4*  --  7.3  HGB 6.1* 9.5* 9.0*  HCT 19.4* 29.1* 27.9*  MCV 98.0  --  93.0  PLT 213  --  210    Cardiac  Enzymes: No results for input(s): CKTOTAL, CKMB, CKMBINDEX, TROPONINI in the last 168 hours.  BNP (last 3 results) No results for input(s): BNP in the last 8760 hours.  ProBNP (last 3 results) No results for input(s): PROBNP in the last 8760 hours.  Radiological Exams: No results found.  Assessment/Plan Active Problems:   Acute on chronic respiratory failure with hypoxia (HCC)   COVID-19 virus infection   Acute GI bleeding   Acute ischemic right MCA stroke (HCC)   Acute pulmonary embolism without acute cor pulmonale (HCC)   1. Acute on chronic respiratory failure with hypoxia we will continue with NAG titrate oxygen as necessary right now is on room air 2. COVID-19 virus infection in resolution phase continue to follow 3. Acute GI bleed no active bleeding 4. Acute stroke no change 5. Acute pulmonary embolism we will continue to monitor patient has had issues with GI bleed so we need to monitor closely   I have personally seen and evaluated the patient, evaluated laboratory and imaging results, formulated the assessment and plan and placed orders. The Patient requires high complexity decision making with multiple systems involvement.  Rounds were done with the Respiratory Therapy Director and Staff therapists and discussed with nursing staff also.  Yevonne Pax, MD [facility] Pulmonary Critical Care Medicine Sleep Medicine

## 2019-12-29 LAB — CBC
HCT: 27.7 % — ABNORMAL LOW (ref 36.0–46.0)
Hemoglobin: 9 g/dL — ABNORMAL LOW (ref 12.0–15.0)
MCH: 29.8 pg (ref 26.0–34.0)
MCHC: 32.5 g/dL (ref 30.0–36.0)
MCV: 91.7 fL (ref 80.0–100.0)
Platelets: 192 10*3/uL (ref 150–400)
RBC: 3.02 MIL/uL — ABNORMAL LOW (ref 3.87–5.11)
RDW: 17.9 % — ABNORMAL HIGH (ref 11.5–15.5)
WBC: 9.6 10*3/uL (ref 4.0–10.5)
nRBC: 0.3 % — ABNORMAL HIGH (ref 0.0–0.2)

## 2019-12-29 NOTE — Progress Notes (Addendum)
Pulmonary Critical Care Medicine [facility] [place]   PULMONARY CRITICAL CARE SERVICE  PROGRESS NOTE  Date of Service: 12/29/2019  Jill Carlson  IRW:[id number]  DOB: 06-02-1951   DOA: 12/19/2019  Referring Physician: Carron Curie, MD  HPI: Jill Carlson is a 69 y.o. female seen for follow up of Acute on Chronic Respiratory Failure.  Patient remains on room air on NAG satting well no fever distress.  Medications: Reviewed on Rounds  Physical Exam:  Vitals: Pulse 67 respirations 24 BP 149/76 O2 sat 100% temp 98.9  Ventilator Settings NAG room air  . General: Comfortable at this time . Eyes: Grossly normal lids, irises & conjunctiva . ENT: grossly tongue is normal . Neck: no obvious mass . Cardiovascular: S1 S2 normal no gallop . Respiratory: No rales or rhonchi noted . Abdomen: soft . Skin: no rash seen on limited exam . Musculoskeletal: not rigid . Psychiatric:unable to assess . Neurologic: no seizure no involuntary movements         Lab Data:   Basic Metabolic Panel: Recent Labs  Lab 12/22/19 1339 12/23/19 0601 12/24/19 0639 12/26/19 1312  NA 151* 149* 143 145  K 4.4 3.4* 4.6 3.6  CL 119* 118* 114* 113*  CO2 22 22 21* 22  GLUCOSE 140* 156* 100* 141*  BUN 29* 31* 35* 25*  CREATININE 1.16* 1.07* 0.93 0.69  CALCIUM 7.8* 7.5* 7.2* 8.1*    ABG: Recent Labs  Lab 12/23/19 0413  PHART 7.401  PCO2ART 40.7  PO2ART 78.1*  HCO3 25.0  O2SAT 96.9    Liver Function Tests: No results for input(s): AST, ALT, ALKPHOS, BILITOT, PROT, ALBUMIN in the last 168 hours. No results for input(s): LIPASE, AMYLASE in the last 168 hours. No results for input(s): AMMONIA in the last 168 hours.  CBC: Recent Labs  Lab 12/24/19 0639 12/25/19 0525 12/28/19 0647 12/29/19 0829  WBC 13.4*  --  7.3 9.6  HGB 6.1* 9.5* 9.0* 9.0*  HCT 19.4* 29.1* 27.9* 27.7*  MCV 98.0  --  93.0 91.7  PLT 213  --  210 192    Cardiac Enzymes: No results for input(s): CKTOTAL,  CKMB, CKMBINDEX, TROPONINI in the last 168 hours.  BNP (last 3 results) No results for input(s): BNP in the last 8760 hours.  ProBNP (last 3 results) No results for input(s): PROBNP in the last 8760 hours.  Radiological Exams: No results found.  Assessment/Plan Active Problems:   Acute on chronic respiratory failure with hypoxia (HCC)   COVID-19 virus infection   Acute GI bleeding   Acute ischemic right MCA stroke (HCC)   Acute pulmonary embolism without acute cor pulmonale (HCC)   1. Acute on chronic respiratory failure with hypoxia we will continue with NAG titrate oxygen as necessary right now is on room air 2. COVID-19 virus infection in resolution phase continue to follow 3. Acute GI bleed no active bleeding 4. Acute stroke no change 5. Acute pulmonary embolism we will continue to monitor patient has had issues with GI bleed so we need to monitor closely   I have personally seen and evaluated the patient, evaluated laboratory and imaging results, formulated the assessment and plan and placed orders. The Patient requires high complexity decision making with multiple systems involvement.  Rounds were done with the Respiratory Therapy Director and Staff therapists and discussed with nursing staff also.  Yevonne Pax, MD [facility] Pulmonary Critical Care Medicine Sleep Medicine

## 2019-12-30 NOTE — Progress Notes (Signed)
Pulmonary Critical Care Medicine [facility] [place]   PULMONARY CRITICAL CARE SERVICE  PROGRESS NOTE  Date of Service: 12/30/2019  Jill Carlson  WNI:[id number]  DOB: 08/30/1951   DOA: 12/19/2019  Referring Physician: Carron Curie, MD  HPI: Jill Carlson is a 69 y.o. female seen for follow up of Acute on Chronic Respiratory Failure.  Patient at this time is on capping has been doing fairly well right now is on room air today will be completing 24 hours.  No issues with secretions noted at this time  Medications: Reviewed on Rounds  Physical Exam:  Vitals: Temperature is 98.4 pulse 79 respiratory 24 blood pressure is 162/70 saturations 96%  Ventilator Settings capping on room air  . General: Comfortable at this time . Eyes: Grossly normal lids, irises & conjunctiva . ENT: grossly tongue is normal . Neck: no obvious mass . Cardiovascular: S1 S2 normal no gallop . Respiratory: No rhonchi no rales . Abdomen: soft . Skin: no rash seen on limited exam . Musculoskeletal: not rigid . Psychiatric:unable to assess . Neurologic: no seizure no involuntary movements         Lab Data:   Basic Metabolic Panel: Recent Labs  Lab 12/24/19 0639 12/26/19 1312  NA 143 145  K 4.6 3.6  CL 114* 113*  CO2 21* 22  GLUCOSE 100* 141*  BUN 35* 25*  CREATININE 0.93 0.69  CALCIUM 7.2* 8.1*    ABG: No results for input(s): PHART, PCO2ART, PO2ART, HCO3, O2SAT in the last 168 hours.  Liver Function Tests: No results for input(s): AST, ALT, ALKPHOS, BILITOT, PROT, ALBUMIN in the last 168 hours. No results for input(s): LIPASE, AMYLASE in the last 168 hours. No results for input(s): AMMONIA in the last 168 hours.  CBC: Recent Labs  Lab 12/24/19 0639 12/25/19 0525 12/28/19 0647 12/29/19 0829  WBC 13.4*  --  7.3 9.6  HGB 6.1* 9.5* 9.0* 9.0*  HCT 19.4* 29.1* 27.9* 27.7*  MCV 98.0  --  93.0 91.7  PLT 213  --  210 192    Cardiac Enzymes: No results for input(s):  CKTOTAL, CKMB, CKMBINDEX, TROPONINI in the last 168 hours.  BNP (last 3 results) No results for input(s): BNP in the last 8760 hours.  ProBNP (last 3 results) No results for input(s): PROBNP in the last 8760 hours.  Radiological Exams: No results found.  Assessment/Plan Active Problems:   Acute on chronic respiratory failure with hypoxia (HCC)   COVID-19 virus infection   Acute GI bleeding   Acute ischemic right MCA stroke (HCC)   Acute pulmonary embolism without acute cor pulmonale (HCC)   1. Acute on chronic respiratory failure with hypoxia plan is to continue with the wean on capping trials.  Patient is going to be completing 24 hours. 2. COVID-19 virus infection resolving 3. Acute GI bleed no active bleeding 4. Acute stroke no change supportive care 5. Acute pulmonary embolism at baseline we will continue to monitor   I have personally seen and evaluated the patient, evaluated laboratory and imaging results, formulated the assessment and plan and placed orders. The Patient requires high complexity decision making with multiple systems involvement.  Rounds were done with the Respiratory Therapy Director and Staff therapists and discussed with nursing staff also.  Yevonne Pax, MD [facility] Pulmonary Critical Care Medicine Sleep Medicine

## 2019-12-31 LAB — CBC
HCT: 26.6 % — ABNORMAL LOW (ref 36.0–46.0)
Hemoglobin: 8.7 g/dL — ABNORMAL LOW (ref 12.0–15.0)
MCH: 30.3 pg (ref 26.0–34.0)
MCHC: 32.7 g/dL (ref 30.0–36.0)
MCV: 92.7 fL (ref 80.0–100.0)
Platelets: 191 10*3/uL (ref 150–400)
RBC: 2.87 MIL/uL — ABNORMAL LOW (ref 3.87–5.11)
RDW: 17.9 % — ABNORMAL HIGH (ref 11.5–15.5)
WBC: 9.9 10*3/uL (ref 4.0–10.5)
nRBC: 0 % (ref 0.0–0.2)

## 2019-12-31 LAB — BASIC METABOLIC PANEL
Anion gap: 11 (ref 5–15)
BUN: 22 mg/dL (ref 8–23)
CO2: 25 mmol/L (ref 22–32)
Calcium: 8 mg/dL — ABNORMAL LOW (ref 8.9–10.3)
Chloride: 108 mmol/L (ref 98–111)
Creatinine, Ser: 0.57 mg/dL (ref 0.44–1.00)
GFR calc Af Amer: >60 mL/min (ref >60)
GFR calc non Af Amer: >60 mL/min (ref >60)
Glucose, Bld: 94 mg/dL (ref 70–99)
Potassium: 2.9 mmol/L — ABNORMAL LOW (ref 3.5–5.1)
Sodium: 144 mmol/L (ref 135–145)

## 2019-12-31 NOTE — Progress Notes (Signed)
Pulmonary Critical Care Medicine [facility] [place]   PULMONARY CRITICAL CARE SERVICE  PROGRESS NOTE  Date of Service: 12/31/2019  Jill Carlson  IOE:[id number]  DOB: 1950/11/14   DOA: 12/19/2019  Referring Physician: Carron Curie, MD  HPI: Jill Carlson is a 69 y.o. female seen for follow up of Acute on Chronic Respiratory Failure.  Patient is doing well with capping trials has has been having increased secretions however  Medications: Reviewed on Rounds  Physical Exam:  Vitals: Temperature is 98.3 pulse 80 respiratory 25 blood pressure is 165/76 saturations 99%  Ventilator Settings capping  . General: Comfortable at this time . Eyes: Grossly normal lids, irises & conjunctiva . ENT: grossly tongue is normal . Neck: no obvious mass . Cardiovascular: S1 S2 normal no gallop . Respiratory: No rhonchi no rales are noted at this time. . Abdomen: soft . Skin: no rash seen on limited exam . Musculoskeletal: not rigid . Psychiatric:unable to assess . Neurologic: no seizure no involuntary movements         Lab Data:   Basic Metabolic Panel: Recent Labs  Lab 12/26/19 1312 12/31/19 0558  NA 145 144  K 3.6 2.9*  CL 113* 108  CO2 22 25  GLUCOSE 141* 94  BUN 25* 22  CREATININE 0.69 0.57  CALCIUM 8.1* 8.0*    ABG: No results for input(s): PHART, PCO2ART, PO2ART, HCO3, O2SAT in the last 168 hours.  Liver Function Tests: No results for input(s): AST, ALT, ALKPHOS, BILITOT, PROT, ALBUMIN in the last 168 hours. No results for input(s): LIPASE, AMYLASE in the last 168 hours. No results for input(s): AMMONIA in the last 168 hours.  CBC: Recent Labs  Lab 12/25/19 0525 12/28/19 0647 12/29/19 0829 12/31/19 0558  WBC  --  7.3 9.6 9.9  HGB 9.5* 9.0* 9.0* 8.7*  HCT 29.1* 27.9* 27.7* 26.6*  MCV  --  93.0 91.7 92.7  PLT  --  210 192 191    Cardiac Enzymes: No results for input(s): CKTOTAL, CKMB, CKMBINDEX, TROPONINI in the last 168 hours.  BNP (last 3  results) No results for input(s): BNP in the last 8760 hours.  ProBNP (last 3 results) No results for input(s): PROBNP in the last 8760 hours.  Radiological Exams: No results found.  Assessment/Plan Active Problems:   Acute on chronic respiratory failure with hypoxia (HCC)   COVID-19 virus infection   Acute GI bleeding   Acute ischemic right MCA stroke (HCC)   Acute pulmonary embolism without acute cor pulmonale (HCC)   1. Acute on chronic respiratory failure with hypoxia we will continue with capping as tolerated the patient does have some congestion continue with aggressive pulmonary toilet 2. COVID-19 virus infection treated we will continue to follow 3. Acute GI bleed at baseline no active bleeding 4. Acute MCA stroke at baseline we will continue supportive care 5. Acute pulmonary embolism treated we will continue to follow   I have personally seen and evaluated the patient, evaluated laboratory and imaging results, formulated the assessment and plan and placed orders. The Patient requires high complexity decision making with multiple systems involvement.  Rounds were done with the Respiratory Therapy Director and Staff therapists and discussed with nursing staff also.  Yevonne Pax, MD [facility] Pulmonary Critical Care Medicine Sleep Medicine

## 2020-01-01 ENCOUNTER — Other Ambulatory Visit (HOSPITAL_COMMUNITY): Payer: Medicare HMO

## 2020-01-01 LAB — BASIC METABOLIC PANEL
Anion gap: 7 (ref 5–15)
BUN: 17 mg/dL (ref 8–23)
CO2: 25 mmol/L (ref 22–32)
Calcium: 8 mg/dL — ABNORMAL LOW (ref 8.9–10.3)
Chloride: 110 mmol/L (ref 98–111)
Creatinine, Ser: 0.55 mg/dL (ref 0.44–1.00)
GFR calc Af Amer: >60 mL/min (ref >60)
GFR calc non Af Amer: >60 mL/min (ref >60)
Glucose, Bld: 99 mg/dL (ref 70–99)
Potassium: 4 mmol/L (ref 3.5–5.1)
Sodium: 142 mmol/L (ref 135–145)

## 2020-01-01 LAB — CBC
HCT: 26.4 % — ABNORMAL LOW (ref 36.0–46.0)
Hemoglobin: 8.6 g/dL — ABNORMAL LOW (ref 12.0–15.0)
MCH: 30.1 pg (ref 26.0–34.0)
MCHC: 32.6 g/dL (ref 30.0–36.0)
MCV: 92.3 fL (ref 80.0–100.0)
Platelets: 183 10*3/uL (ref 150–400)
RBC: 2.86 MIL/uL — ABNORMAL LOW (ref 3.87–5.11)
RDW: 17.9 % — ABNORMAL HIGH (ref 11.5–15.5)
WBC: 10.7 10*3/uL — ABNORMAL HIGH (ref 4.0–10.5)
nRBC: 0.2 % (ref 0.0–0.2)

## 2020-01-01 NOTE — Progress Notes (Signed)
Pulmonary Critical Care Medicine [facility] [place]   PULMONARY CRITICAL CARE SERVICE  PROGRESS NOTE  Date of Service: 01/01/2020  Jill Carlson  QIH:[id number]  DOB: December 21, 1950   DOA: 12/19/2019  Referring Physician: Carron Curie, MD  HPI: Jill Carlson is a 69 y.o. female seen for follow up of Acute on Chronic Respiratory Failure.  Patient is capping today will be 72 hours ready for decannulation  Medications: Reviewed on Rounds  Physical Exam:  Vitals: Temperature is 99.6 pulse 84 respiratory 29 blood pressure is 173/73 saturations 98%  Ventilator Settings capping off the ventilator  . General: Comfortable at this time . Eyes: Grossly normal lids, irises & conjunctiva . ENT: grossly tongue is normal . Neck: no obvious mass . Cardiovascular: S1 S2 normal no gallop . Respiratory: No rhonchi no rales . Abdomen: soft . Skin: no rash seen on limited exam . Musculoskeletal: not rigid . Psychiatric:unable to assess . Neurologic: no seizure no involuntary movements         Lab Data:   Basic Metabolic Panel: Recent Labs  Lab 12/26/19 1312 12/31/19 0558 01/01/20 0408  NA 145 144 142  K 3.6 2.9* 4.0  CL 113* 108 110  CO2 22 25 25   GLUCOSE 141* 94 99  BUN 25* 22 17  CREATININE 0.69 0.57 0.55  CALCIUM 8.1* 8.0* 8.0*    ABG: No results for input(s): PHART, PCO2ART, PO2ART, HCO3, O2SAT in the last 168 hours.  Liver Function Tests: No results for input(s): AST, ALT, ALKPHOS, BILITOT, PROT, ALBUMIN in the last 168 hours. No results for input(s): LIPASE, AMYLASE in the last 168 hours. No results for input(s): AMMONIA in the last 168 hours.  CBC: Recent Labs  Lab 12/28/19 0647 12/29/19 0829 12/31/19 0558 01/01/20 0408  WBC 7.3 9.6 9.9 10.7*  HGB 9.0* 9.0* 8.7* 8.6*  HCT 27.9* 27.7* 26.6* 26.4*  MCV 93.0 91.7 92.7 92.3  PLT 210 192 191 183    Cardiac Enzymes: No results for input(s): CKTOTAL, CKMB, CKMBINDEX, TROPONINI in the last 168  hours.  BNP (last 3 results) No results for input(s): BNP in the last 8760 hours.  ProBNP (last 3 results) No results for input(s): PROBNP in the last 8760 hours.  Radiological Exams: No results found.  Assessment/Plan Active Problems:   Acute on chronic respiratory failure with hypoxia (HCC)   COVID-19 virus infection   Acute GI bleeding   Acute ischemic right MCA stroke (HCC)   Acute pulmonary embolism without acute cor pulmonale (HCC)   1. Acute on chronic respiratory failure hypoxia plan is to proceed to decannulation.  Patient has had no secretions reported by respiratory therapy in addition has good cough 2. COVID-19 virus infection resolved 3. Acute GI bleed no active bleeding 4. Acute stroke no change we will continue with supportive care 5. Pulmonary embolism treated continue to monitor closely   I have personally seen and evaluated the patient, evaluated laboratory and imaging results, formulated the assessment and plan and placed orders. The Patient requires high complexity decision making with multiple systems involvement.  Rounds were done with the Respiratory Therapy Director and Staff therapists and discussed with nursing staff also.  01/03/20, MD [facility] Pulmonary Critical Care Medicine Sleep Medicine

## 2020-01-02 NOTE — Progress Notes (Signed)
Pulmonary Critical Care Medicine [facility] [place]   PULMONARY CRITICAL CARE SERVICE  PROGRESS NOTE  Date of Service: 01/02/2020  Jill Carlson  MHD:[id number]  DOB: 25-Apr-1951   DOA: 12/19/2019  Referring Physician: Carron Curie, MD  HPI: Jill Carlson is a 69 y.o. female seen for follow up of Acute on Chronic Respiratory Failure.  Patient has been decannulated resting comfortably without any issues secretions are minimal  Medications: Reviewed on Rounds  Physical Exam:  Vitals: Temperature is 99.6 pulse 81 respiratory 24 blood pressure is 164/81 saturations 96%  Ventilator Settings decannulated off the vent  . General: Comfortable at this time . Eyes: Grossly normal lids, irises & conjunctiva . ENT: grossly tongue is normal . Neck: no obvious mass . Cardiovascular: S1 S2 normal no gallop . Respiratory: No rhonchi no rales . Abdomen: soft . Skin: no rash seen on limited exam . Musculoskeletal: not rigid . Psychiatric:unable to assess . Neurologic: no seizure no involuntary movements         Lab Data:   Basic Metabolic Panel: Recent Labs  Lab 12/26/19 1312 12/31/19 0558 01/01/20 0408  NA 145 144 142  K 3.6 2.9* 4.0  CL 113* 108 110  CO2 22 25 25   GLUCOSE 141* 94 99  BUN 25* 22 17  CREATININE 0.69 0.57 0.55  CALCIUM 8.1* 8.0* 8.0*    ABG: No results for input(s): PHART, PCO2ART, PO2ART, HCO3, O2SAT in the last 168 hours.  Liver Function Tests: No results for input(s): AST, ALT, ALKPHOS, BILITOT, PROT, ALBUMIN in the last 168 hours. No results for input(s): LIPASE, AMYLASE in the last 168 hours. No results for input(s): AMMONIA in the last 168 hours.  CBC: Recent Labs  Lab 12/28/19 0647 12/29/19 0829 12/31/19 0558 01/01/20 0408  WBC 7.3 9.6 9.9 10.7*  HGB 9.0* 9.0* 8.7* 8.6*  HCT 27.9* 27.7* 26.6* 26.4*  MCV 93.0 91.7 92.7 92.3  PLT 210 192 191 183    Cardiac Enzymes: No results for input(s): CKTOTAL, CKMB, CKMBINDEX, TROPONINI  in the last 168 hours.  BNP (last 3 results) No results for input(s): BNP in the last 8760 hours.  ProBNP (last 3 results) No results for input(s): PROBNP in the last 8760 hours.  Radiological Exams: DG Chest Port 1 View  Result Date: 01/01/2020 CLINICAL DATA:  Low-grade fever. EXAM: PORTABLE CHEST 1 VIEW COMPARISON:  December 19, 2019 FINDINGS: The tracheostomy tube and right-sided PICC line seen on the prior study have been removed. There is mild prominence of the perihilar and infrahilar pulmonary vasculature. There is no evidence of focal consolidation. A small, stable left pleural effusion is seen. There is no evidence of pneumothorax. The heart size and mediastinal contours are within normal limits. The visualized skeletal structures are unremarkable. IMPRESSION: 1. Mild prominence of the perihilar and infrahilar pulmonary vasculature suggesting mild pulmonary vascular congestion. 2. Small, stable left pleural effusion. Electronically Signed   By: December 21, 2019 M.D.   On: 01/01/2020 15:29    Assessment/Plan Active Problems:   Acute on chronic respiratory failure with hypoxia (HCC)   COVID-19 virus infection   Acute GI bleeding   Acute ischemic right MCA stroke (HCC)   Acute pulmonary embolism without acute cor pulmonale (HCC)   1. Acute on chronic respiratory failure hypoxia we will continue with supportive care pulmonary toilet 2. COVID-19 virus infection resolved 3. Acute GI bleed no active bleeding 4. Acute stroke no change we will continue to monitor 5. Pulmonary embolism treated  I have personally seen and evaluated the patient, evaluated laboratory and imaging results, formulated the assessment and plan and placed orders. The Patient requires high complexity decision making with multiple systems involvement.  Rounds were done with the Respiratory Therapy Director and Staff therapists and discussed with nursing staff also.  Allyne Gee, MD [facility] Pulmonary Critical Care  Medicine Sleep Medicine

## 2020-01-03 LAB — BASIC METABOLIC PANEL
Anion gap: 11 (ref 5–15)
BUN: 18 mg/dL (ref 8–23)
CO2: 24 mmol/L (ref 22–32)
Calcium: 8.4 mg/dL — ABNORMAL LOW (ref 8.9–10.3)
Chloride: 104 mmol/L (ref 98–111)
Creatinine, Ser: 0.67 mg/dL (ref 0.44–1.00)
GFR calc Af Amer: >60 mL/min (ref >60)
GFR calc non Af Amer: >60 mL/min (ref >60)
Glucose, Bld: 131 mg/dL — ABNORMAL HIGH (ref 70–99)
Potassium: 3.4 mmol/L — ABNORMAL LOW (ref 3.5–5.1)
Sodium: 139 mmol/L (ref 135–145)

## 2020-01-03 LAB — CBC
HCT: 27.4 % — ABNORMAL LOW (ref 36.0–46.0)
Hemoglobin: 8.8 g/dL — ABNORMAL LOW (ref 12.0–15.0)
MCH: 29.9 pg (ref 26.0–34.0)
MCHC: 32.1 g/dL (ref 30.0–36.0)
MCV: 93.2 fL (ref 80.0–100.0)
Platelets: 168 10*3/uL (ref 150–400)
RBC: 2.94 MIL/uL — ABNORMAL LOW (ref 3.87–5.11)
RDW: 18.2 % — ABNORMAL HIGH (ref 11.5–15.5)
WBC: 13.6 10*3/uL — ABNORMAL HIGH (ref 4.0–10.5)
nRBC: 0 % (ref 0.0–0.2)

## 2020-01-07 LAB — CBC
HCT: 26.8 % — ABNORMAL LOW (ref 36.0–46.0)
Hemoglobin: 8.7 g/dL — ABNORMAL LOW (ref 12.0–15.0)
MCH: 30.3 pg (ref 26.0–34.0)
MCHC: 32.5 g/dL (ref 30.0–36.0)
MCV: 93.4 fL (ref 80.0–100.0)
Platelets: 259 10*3/uL (ref 150–400)
RBC: 2.87 MIL/uL — ABNORMAL LOW (ref 3.87–5.11)
RDW: 18.3 % — ABNORMAL HIGH (ref 11.5–15.5)
WBC: 12.2 10*3/uL — ABNORMAL HIGH (ref 4.0–10.5)
nRBC: 0 % (ref 0.0–0.2)

## 2020-02-17 DEATH — deceased

## 2021-02-09 IMAGING — NM NM GI BLOOD LOSS
2 series · 12 of 12 positions shown · non-contrast
Comparison: None.

CLINICAL DATA: Gastrointestinal bleeding.

EXAM:
NUCLEAR MEDICINE GASTROINTESTINAL BLEEDING SCAN
TECHNIQUE: Sequential abdominal images were obtained following intravenous
administration of Hc-77m labeled red blood cells.
RADIOPHARMACEUTICALS:  25.7 mCi Hc-77m pertechnetate in-vitro
labeled red cells.

[gi gi bleed · 4.52mm/px · 6 of 60 frames shown (1 of 2)]
[frame 6/60]
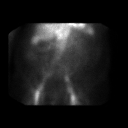
[frame 16/60]
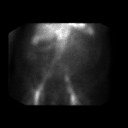
[frame 26/60]
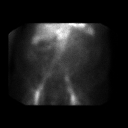
[frame 36/60]
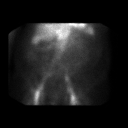
[frame 46/60]
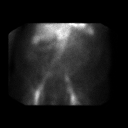
[frame 56/60]
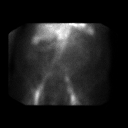

[gi gi bleed · 4.52mm/px · 6 of 60 frames shown (2 of 2)]
[frame 6/60]
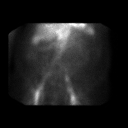
[frame 16/60]
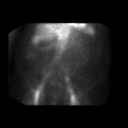
[frame 26/60]
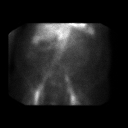
[frame 36/60]
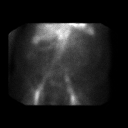
[frame 46/60]
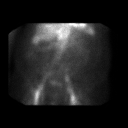
[frame 56/60]
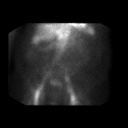

[12 of 12 positions shown; findings below may reference images not displayed]

FINDINGS: No abnormal focus of activity is demonstrated in the bowel to
suggest a lower GI bleed. Normal physiologic biodistribution of the
radiotracer is demonstrated throughout the abdomen.
IMPRESSION: No evidence of active gastrointestinal bleeding.

## 2021-02-11 IMAGING — DX DG CHEST 1V PORT
1 series · 1 of 1 positions shown · non-contrast
Comparison: 12/16/2019

CLINICAL DATA: Respiratory failure

EXAM:
PORTABLE CHEST 1 VIEW

[chest ap]
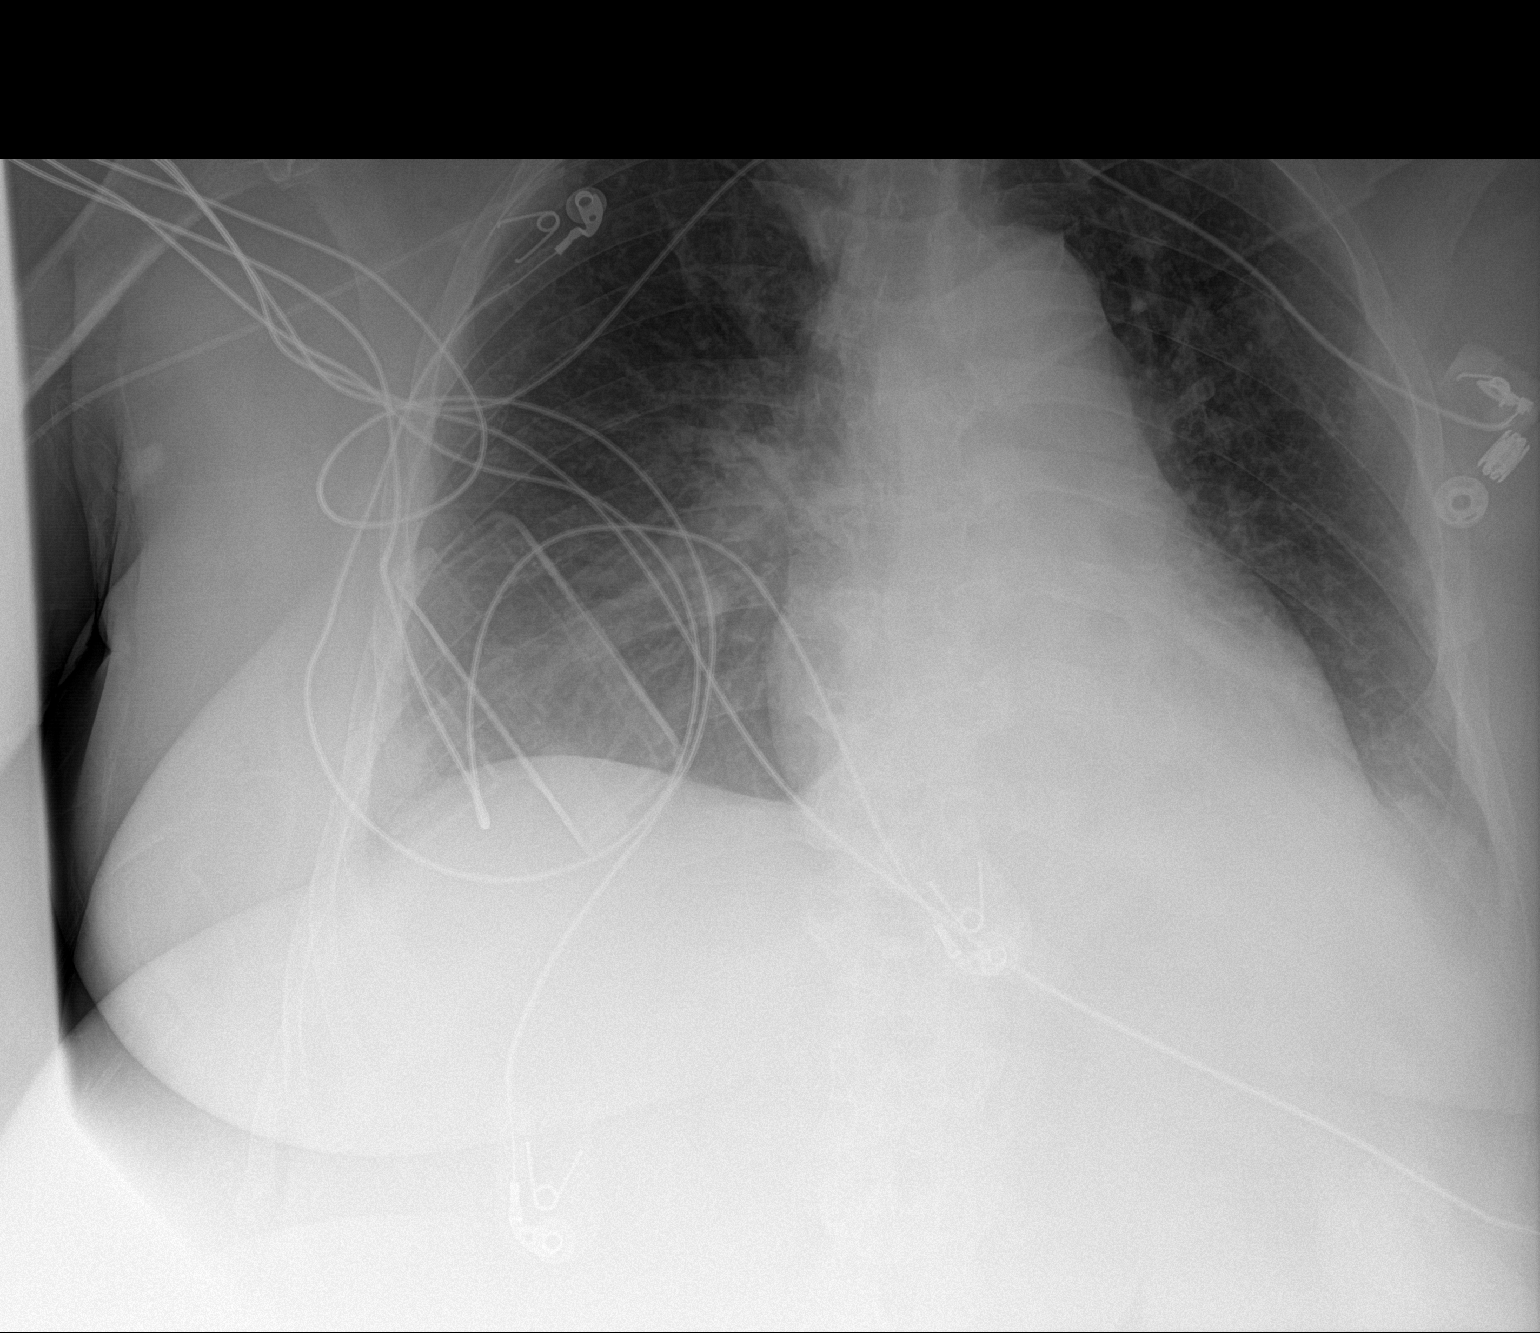

[1 of 1 positions shown; findings below may reference images not displayed]

FINDINGS: Tracheostomy tube and right-sided PICC line are again seen and
stable. Cardiac shadow is stable. Small left pleural effusion is
noted. The overall degree of aeration has improved when compare with
the prior exam. Improved vascular congestion is noted as well.
Persistent left basilar atelectasis is seen.
IMPRESSION: Overall improved aeration bilaterally with reduction of vascular
congestion.

Tubes and lines as described.

## 2021-02-11 IMAGING — DX DG ABDOMEN 1V
1 series · 1 of 1 positions shown · non-contrast
Comparison: None.

CLINICAL DATA: Evaluate gastrostomy placement

EXAM:
ABDOMEN - 1 VIEW

[abdomen kub]
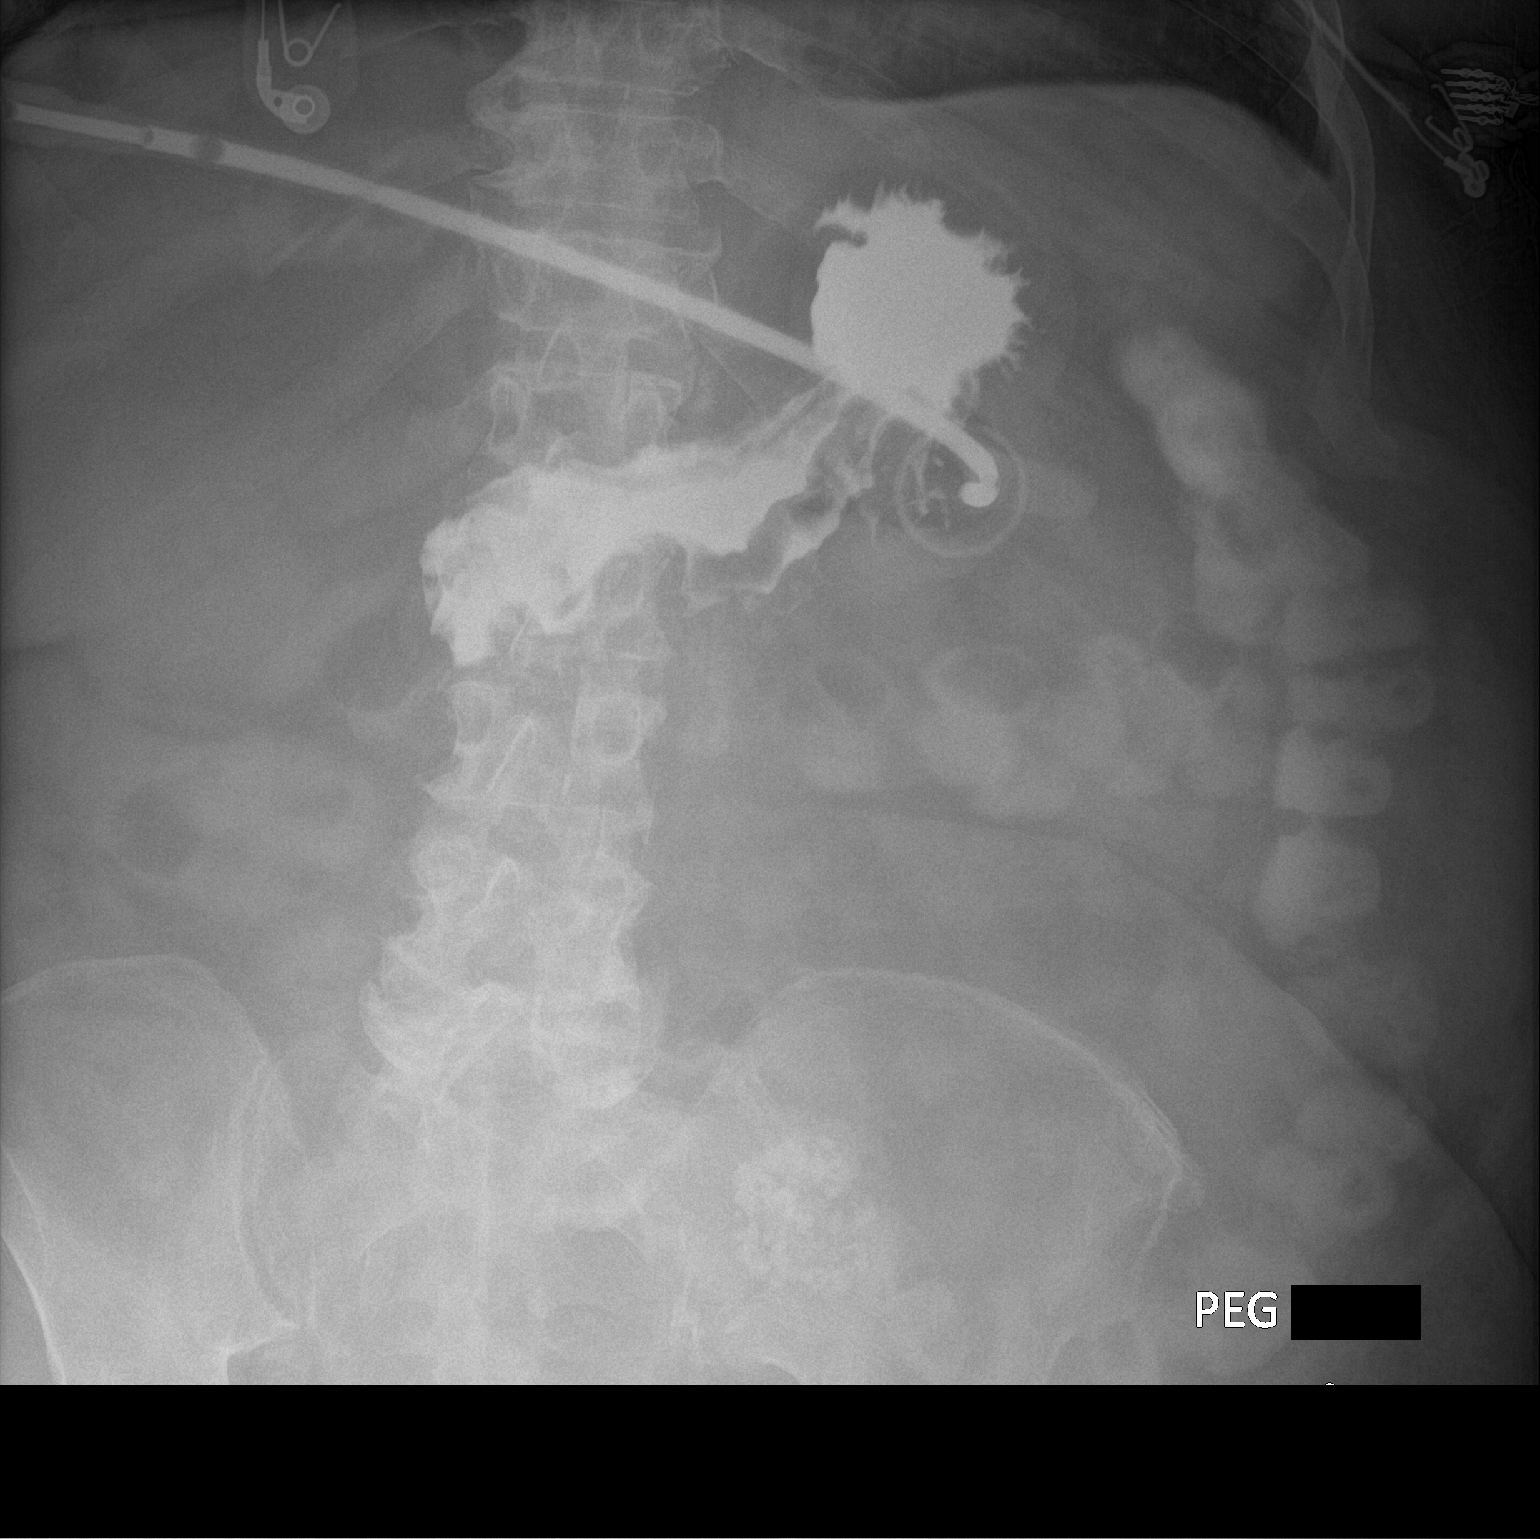

[1 of 1 positions shown; findings below may reference images not displayed]

FINDINGS: Contrast material was injected through an indwelling gastrostomy
catheter. Contrast flows directly into the stomach. This is similar
to that seen on prior examination. Calcification over the pelvis is
noted consistent with uterine fibroid.
IMPRESSION: Gastrostomy catheter within the stomach.

## 2021-02-24 IMAGING — DX DG CHEST 1V PORT
1 series · 1 of 1 positions shown · non-contrast
Comparison: December 19, 2019

CLINICAL DATA: Low-grade fever.

EXAM:
PORTABLE CHEST 1 VIEW

[chest ap]
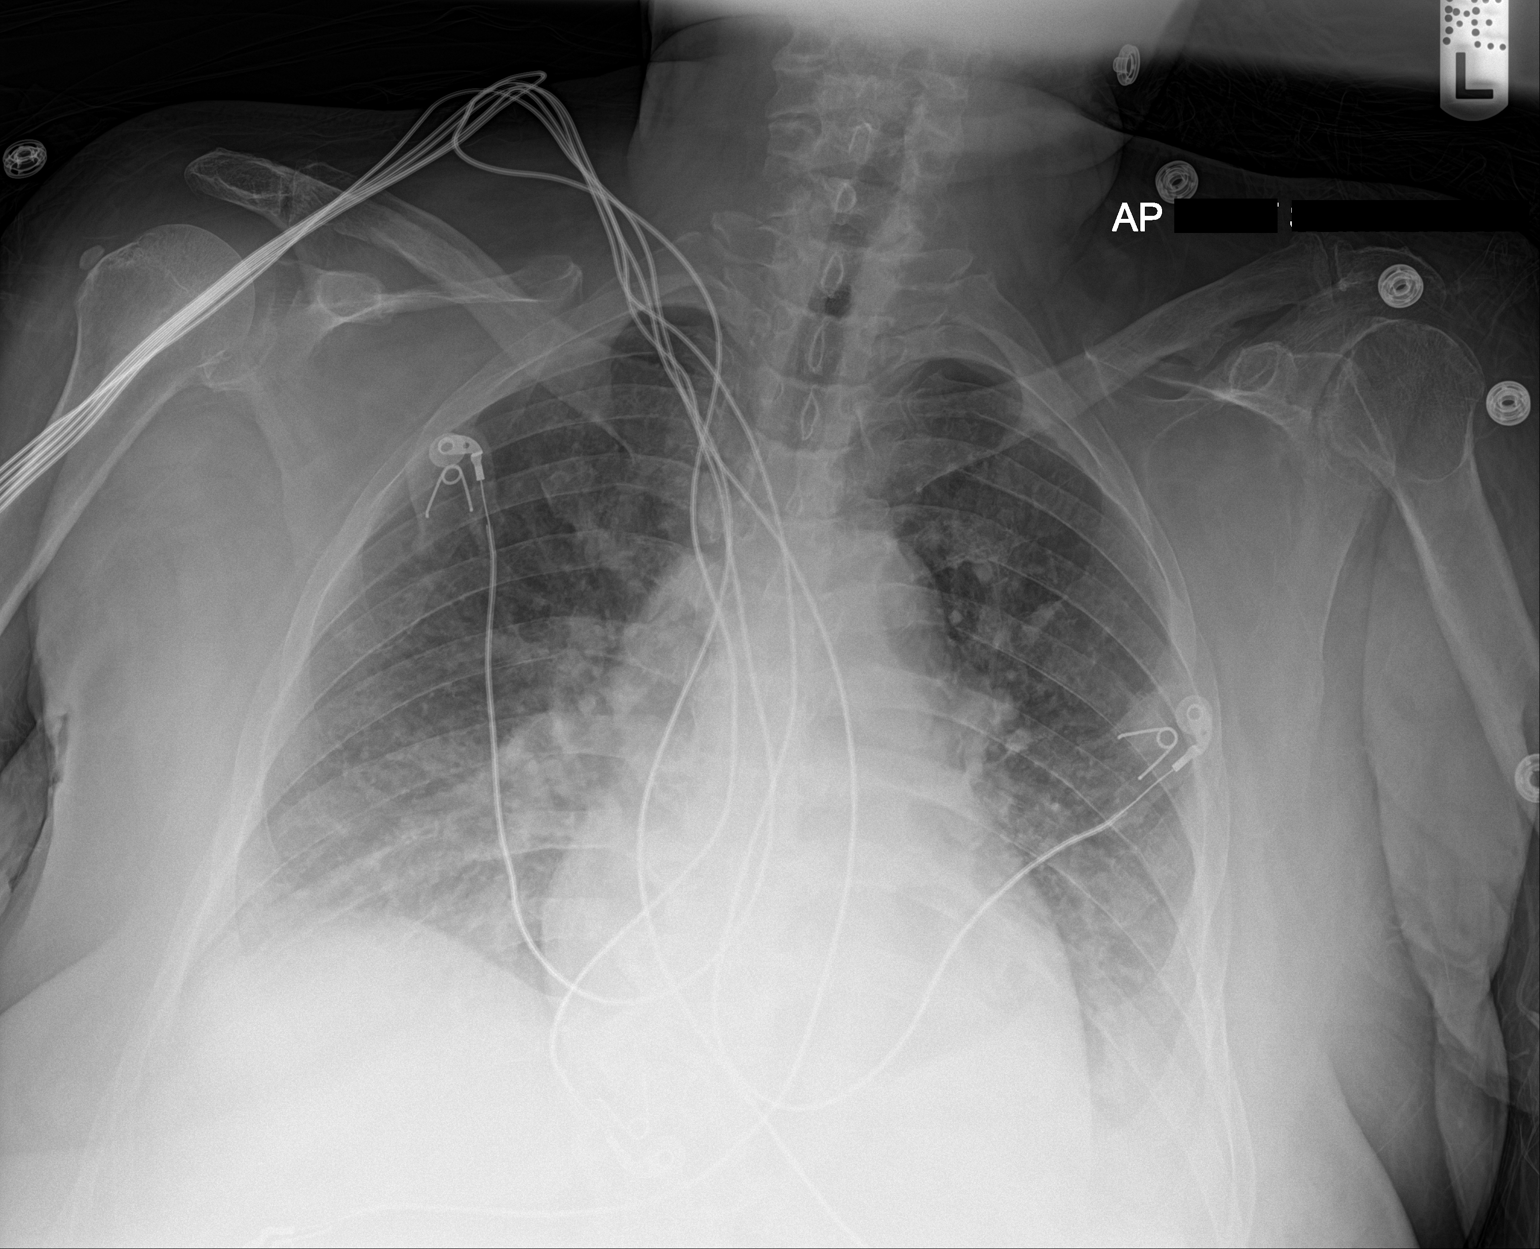

[1 of 1 positions shown; findings below may reference images not displayed]

FINDINGS: The tracheostomy tube and right-sided PICC line seen on the prior
study have been removed.

There is mild prominence of the perihilar and infrahilar pulmonary
vasculature.

There is no evidence of focal consolidation.

A small, stable left pleural effusion is seen.

There is no evidence of pneumothorax.

The heart size and mediastinal contours are within normal limits.

The visualized skeletal structures are unremarkable.
IMPRESSION: 1. Mild prominence of the perihilar and infrahilar pulmonary
vasculature suggesting mild pulmonary vascular congestion.
2. Small, stable left pleural effusion.
